# Patient Record
Sex: Male | Born: 1950 | Race: White | Hispanic: No | Marital: Married | Smoking: Never smoker
Health system: Southern US, Community
[De-identification: ages and names within clinical notes are randomized; demographics above are authoritative.]

## PROBLEM LIST (undated history)

## (undated) DIAGNOSIS — I2510 Atherosclerotic heart disease of native coronary artery without angina pectoris: Secondary | ICD-10-CM

## (undated) DIAGNOSIS — Z9289 Personal history of other medical treatment: Secondary | ICD-10-CM

## (undated) DIAGNOSIS — Z8739 Personal history of other diseases of the musculoskeletal system and connective tissue: Secondary | ICD-10-CM

## (undated) DIAGNOSIS — R739 Hyperglycemia, unspecified: Secondary | ICD-10-CM

## (undated) DIAGNOSIS — J189 Pneumonia, unspecified organism: Secondary | ICD-10-CM

## (undated) DIAGNOSIS — B1920 Unspecified viral hepatitis C without hepatic coma: Secondary | ICD-10-CM

## (undated) DIAGNOSIS — E785 Hyperlipidemia, unspecified: Secondary | ICD-10-CM

## (undated) HISTORY — PX: NOSE SURGERY: SHX723

## (undated) HISTORY — PX: EYE SURGERY: SHX253

## (undated) HISTORY — PX: HAND SURGERY: SHX662

## (undated) HISTORY — PX: RETINAL DETACHMENT SURGERY: SHX105

## (undated) HISTORY — PX: INGUINAL HERNIA REPAIR: SUR1180

## (undated) HISTORY — PX: FEMUR FRACTURE SURGERY: SHX633

---

## 1985-09-18 DIAGNOSIS — Z9289 Personal history of other medical treatment: Secondary | ICD-10-CM

## 1985-09-18 HISTORY — DX: Personal history of other medical treatment: Z92.89

## 2014-09-18 DIAGNOSIS — B1920 Unspecified viral hepatitis C without hepatic coma: Secondary | ICD-10-CM

## 2014-09-18 HISTORY — DX: Unspecified viral hepatitis C without hepatic coma: B19.20

## 2015-07-20 ENCOUNTER — Encounter: Payer: Self-pay | Admitting: Physician Assistant

## 2015-07-20 ENCOUNTER — Ambulatory Visit: Payer: Self-pay | Admitting: Physician Assistant

## 2015-07-20 VITALS — BP 120/70 | HR 88 | Temp 98.4°F

## 2015-07-20 DIAGNOSIS — J018 Other acute sinusitis: Secondary | ICD-10-CM

## 2015-07-20 MED ORDER — CEFDINIR 300 MG PO CAPS: 300.0000 mg | ORAL_CAPSULE | Freq: Two times a day (BID) | ORAL | Status: DC

## 2015-07-20 MED ORDER — FLUTICASONE PROPIONATE 50 MCG/ACT NA SUSP: 2.0000 | Freq: Every day | NASAL | Status: DC

## 2015-07-20 NOTE — Progress Notes (Signed)
S: C/o runny nose and congestion for 3 days, no fever, chills, cp/sob, v/d;  Also voice is hoarse,  cough is sporadic, .   Using otc meds:   O: PE: vitals wnl, nad,  perrl eomi, normocephalic, tms dull, nasal mucosa red and swollen, throat injected, neck supple no lymph, lungs c t a, cv rrr, neuro intact  A:  Acute sinusitis   P: omnicef 300mg  bid x 10d, flonase, otc delsym; drink fluids, continue regular meds , use otc meds of choice, return if not improving in 5 days, return earlier if worsening

## 2015-07-27 ENCOUNTER — Ambulatory Visit: Payer: Self-pay | Admitting: Physician Assistant

## 2015-07-27 ENCOUNTER — Encounter: Payer: Self-pay | Admitting: Physician Assistant

## 2015-07-27 VITALS — BP 110/75 | HR 89 | Temp 98.5°F

## 2015-07-27 DIAGNOSIS — B1920 Unspecified viral hepatitis C without hepatic coma: Secondary | ICD-10-CM | POA: Insufficient documentation

## 2015-07-27 NOTE — Progress Notes (Signed)
S: recheck of cough and congestion, no fever/chills, feels like still in chest, no cp/sob, mucus is getting more clear, still has drainage  O: vitals wnl, nad, ENT wnl, neck supple no lymph, lungs with coarse bs b/l in lower lungs, cv rrr, no edema noted  A: acute bronchitis, ?pneumonia  P: pt has cipro and wants to take this, trial of cipro 500mg  bid for 4 days, if not improving in 2 days will send cxr order to opt [facility]

## 2015-11-02 ENCOUNTER — Ambulatory Visit: Payer: Self-pay | Admitting: Physician Assistant

## 2015-11-02 ENCOUNTER — Encounter: Payer: Self-pay | Admitting: Physician Assistant

## 2015-11-02 VITALS — BP 119/70 | HR 85 | Temp 98.2°F

## 2015-11-02 DIAGNOSIS — R21 Rash and other nonspecific skin eruption: Secondary | ICD-10-CM

## 2015-11-02 MED ORDER — MUPIROCIN 2 % EX OINT: 1.0000 "application " | TOPICAL_OINTMENT | Freq: Two times a day (BID) | CUTANEOUS | Status: DC
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
## 2016-02-22 ENCOUNTER — Encounter: Payer: Self-pay | Admitting: Physician Assistant

## 2016-02-22 ENCOUNTER — Ambulatory Visit: Payer: Self-pay | Admitting: Physician Assistant

## 2016-02-22 VITALS — BP 122/70 | HR 101 | Temp 99.6°F

## 2016-02-22 DIAGNOSIS — M2540 Effusion, unspecified joint: Secondary | ICD-10-CM

## 2016-02-22 MED ORDER — INDOMETHACIN 25 MG PO CAPS
25.0000 mg | ORAL_CAPSULE | Freq: Two times a day (BID) | ORAL | Status: DC
Start: 2016-02-22 — End: 2017-02-22

## 2016-02-22 MED ORDER — CEPHALEXIN 500 MG PO CAPS: 500.0000 mg | ORAL_CAPSULE | Freq: Three times a day (TID) | ORAL | Status: DC

## 2016-02-22 NOTE — Progress Notes (Signed)
S: c/o left knee being swollen , hx of infection in same knee, sometimes when he does too much it will flare, worried that it will cause more damage, has temp today, hx of gout  O: vitals wnl, nad, skin intact, some warmth and redness at left knee, swelling superiorly, full rom, n/v intact  A: left knee pain, bursitis  P: keflex, indocin, cbc,  Uric acid drawn today

## 2016-02-23 LAB — CBC WITH DIFFERENTIAL/PLATELET
BASOS: 0 %
Basophils Absolute: 0 10*3/uL (ref 0.0–0.2)
EOS (ABSOLUTE): 0.1 10*3/uL (ref 0.0–0.4)
Eos: 1 %
HEMATOCRIT: 38.4 % (ref 37.5–51.0)
Hemoglobin: 13.1 g/dL (ref 12.6–17.7)
Immature Grans (Abs): 0 10*3/uL (ref 0.0–0.1)
Immature Granulocytes: 0 %
Lymphocytes Absolute: 0.9 10*3/uL (ref 0.7–3.1)
Lymphs: 10 %
MCH: 30.3 pg (ref 26.6–33.0)
MCHC: 34.1 g/dL (ref 31.5–35.7)
MCV: 89 fL (ref 79–97)
MONOS ABS: 0.7 10*3/uL (ref 0.1–0.9)
Monocytes: 8 %
NEUTROS ABS: 7.4 10*3/uL — AB (ref 1.4–7.0)
Neutrophils: 81 %
PLATELETS: 143 10*3/uL — AB (ref 150–379)
RBC: 4.32 x10E6/uL (ref 4.14–5.80)
RDW: 13.5 % (ref 12.3–15.4)
WBC: 9.1 10*3/uL (ref 3.4–10.8)

## 2016-02-23 LAB — URIC ACID: Uric Acid: 6.9 mg/dL (ref 3.7–8.6)

## 2017-02-20 ENCOUNTER — Emergency Department: Payer: Medicare HMO

## 2017-02-20 ENCOUNTER — Inpatient Hospital Stay
Admission: EM | Admit: 2017-02-20 | Discharge: 2017-02-22 | DRG: 287 | Disposition: A | Discharge status: Other Acute Inpatient Hospital | Payer: Medicare HMO | Attending: Cardiology | Admitting: Cardiology

## 2017-02-20 ENCOUNTER — Encounter: Payer: Self-pay | Admitting: Emergency Medicine

## 2017-02-20 ENCOUNTER — Observation Stay
Admit: 2017-02-20 | Discharge: 2017-02-20 | Disposition: A | Discharge status: Home / Self Care | Payer: Medicare HMO | Attending: Internal Medicine | Admitting: Internal Medicine

## 2017-02-20 DIAGNOSIS — I2510 Atherosclerotic heart disease of native coronary artery without angina pectoris: Secondary | ICD-10-CM

## 2017-02-20 DIAGNOSIS — R079 Chest pain, unspecified: Secondary | ICD-10-CM

## 2017-02-20 DIAGNOSIS — Z87891 Personal history of nicotine dependence: Secondary | ICD-10-CM

## 2017-02-20 DIAGNOSIS — K740 Hepatic fibrosis: Secondary | ICD-10-CM | POA: Diagnosis present

## 2017-02-20 DIAGNOSIS — B1920 Unspecified viral hepatitis C without hepatic coma: Secondary | ICD-10-CM | POA: Diagnosis present

## 2017-02-20 DIAGNOSIS — I214 Non-ST elevation (NSTEMI) myocardial infarction: Secondary | ICD-10-CM

## 2017-02-20 DIAGNOSIS — K7460 Unspecified cirrhosis of liver: Secondary | ICD-10-CM | POA: Diagnosis present

## 2017-02-20 DIAGNOSIS — R739 Hyperglycemia, unspecified: Secondary | ICD-10-CM | POA: Diagnosis present

## 2017-02-20 DIAGNOSIS — I200 Unstable angina: Secondary | ICD-10-CM | POA: Diagnosis present

## 2017-02-20 DIAGNOSIS — I25110 Atherosclerotic heart disease of native coronary artery with unstable angina pectoris: Principal | ICD-10-CM | POA: Diagnosis present

## 2017-02-20 DIAGNOSIS — E785 Hyperlipidemia, unspecified: Secondary | ICD-10-CM | POA: Diagnosis present

## 2017-02-20 DIAGNOSIS — Z79899 Other long term (current) drug therapy: Secondary | ICD-10-CM

## 2017-02-20 DIAGNOSIS — M109 Gout, unspecified: Secondary | ICD-10-CM | POA: Diagnosis present

## 2017-02-20 DIAGNOSIS — D696 Thrombocytopenia, unspecified: Secondary | ICD-10-CM | POA: Diagnosis present

## 2017-02-20 DIAGNOSIS — R0600 Dyspnea, unspecified: Secondary | ICD-10-CM | POA: Diagnosis present

## 2017-02-20 HISTORY — DX: Unspecified viral hepatitis C without hepatic coma: B19.20

## 2017-02-20 HISTORY — DX: Atherosclerotic heart disease of native coronary artery without angina pectoris: I25.10

## 2017-02-20 HISTORY — DX: Hyperglycemia, unspecified: R73.9

## 2017-02-20 HISTORY — DX: Hyperlipidemia, unspecified: E78.5

## 2017-02-20 HISTORY — DX: Personal history of other medical treatment: Z92.89

## 2017-02-20 LAB — BASIC METABOLIC PANEL
Anion gap: 7 (ref 5–15)
BUN: 14 mg/dL (ref 6–20)
CO2: 25 mmol/L (ref 22–32)
Calcium: 9.5 mg/dL (ref 8.9–10.3)
Chloride: 106 mmol/L (ref 101–111)
Creatinine, Ser: 0.81 mg/dL (ref 0.61–1.24)
Glucose, Bld: 111 mg/dL — ABNORMAL HIGH (ref 65–99)
Potassium: 3.9 mmol/L (ref 3.5–5.1)
SODIUM: 138 mmol/L (ref 135–145)

## 2017-02-20 LAB — URINE DRUG SCREEN, QUALITATIVE (ARMC ONLY)
Amphetamines, Ur Screen: NOT DETECTED
BARBITURATES, UR SCREEN: NOT DETECTED
BENZODIAZEPINE, UR SCRN: NOT DETECTED
Cannabinoid 50 Ng, Ur ~~LOC~~: NOT DETECTED
Cocaine Metabolite,Ur ~~LOC~~: NOT DETECTED
MDMA (Ecstasy)Ur Screen: NOT DETECTED
METHADONE SCREEN, URINE: NOT DETECTED
Opiate, Ur Screen: NOT DETECTED
Phencyclidine (PCP) Ur S: NOT DETECTED
TRICYCLIC, UR SCREEN: NOT DETECTED

## 2017-02-20 LAB — CBC
HCT: 41.6 % (ref 40.0–52.0)
Hemoglobin: 14.7 g/dL (ref 13.0–18.0)
MCH: 30.4 pg (ref 26.0–34.0)
MCHC: 35.3 g/dL (ref 32.0–36.0)
MCV: 86.1 fL (ref 80.0–100.0)
Platelets: 147 10*3/uL — ABNORMAL LOW (ref 150–440)
RBC: 4.84 MIL/uL (ref 4.40–5.90)
RDW: 13.7 % (ref 11.5–14.5)
WBC: 6.8 10*3/uL (ref 3.8–10.6)

## 2017-02-20 LAB — TROPONIN I
TROPONIN I: 0.04 ng/mL — AB (ref <0.03)
Troponin I: 0.05 ng/mL (ref <0.03)
Troponin I: 0.04 ng/mL (ref <0.03)

## 2017-02-20 LAB — ETHANOL

## 2017-02-20 MED ORDER — HYDROCODONE-ACETAMINOPHEN 5-325 MG PO TABS: 1.0000 | ORAL_TABLET | ORAL | Status: DC | PRN

## 2017-02-20 MED ORDER — DOCUSATE SODIUM 100 MG PO CAPS
100.0000 mg | ORAL_CAPSULE | Freq: Two times a day (BID) | ORAL | Status: DC
  Administered 2017-02-20 – 2017-02-21 (×4): 100 mg via ORAL
  Filled 2017-02-20 (×4): qty 1

## 2017-02-20 MED ORDER — ASPIRIN EC 81 MG PO TBEC
81.0000 mg | DELAYED_RELEASE_TABLET | Freq: Every day | ORAL | Status: DC
  Administered 2017-02-21 – 2017-02-22 (×2): 81 mg via ORAL
  Filled 2017-02-20 (×2): qty 1

## 2017-02-20 MED ORDER — ACETAMINOPHEN 650 MG RE SUPP: 650.0000 mg | Freq: Four times a day (QID) | RECTAL | Status: DC | PRN

## 2017-02-20 MED ORDER — NITROGLYCERIN 0.4 MG SL SUBL: 0.4000 mg | SUBLINGUAL_TABLET | SUBLINGUAL | Status: DC | PRN

## 2017-02-20 MED ORDER — TRAZODONE HCL 50 MG PO TABS
25.0000 mg | ORAL_TABLET | Freq: Every evening | ORAL | Status: DC | PRN
  Administered 2017-02-20 – 2017-02-21 (×2): 25 mg via ORAL
  Filled 2017-02-20 (×2): qty 1

## 2017-02-20 MED ORDER — ASPIRIN 81 MG PO CHEW
324.0000 mg | CHEWABLE_TABLET | Freq: Once | ORAL | Status: AC
  Administered 2017-02-20: 324 mg via ORAL
  Filled 2017-02-20: qty 4

## 2017-02-20 MED ORDER — ONDANSETRON HCL 4 MG PO TABS: 4.0000 mg | ORAL_TABLET | Freq: Four times a day (QID) | ORAL | Status: DC | PRN

## 2017-02-20 MED ORDER — BISACODYL 5 MG PO TBEC
5.0000 mg | DELAYED_RELEASE_TABLET | Freq: Every day | ORAL | Status: DC | PRN
  Filled 2017-02-20: qty 1

## 2017-02-20 MED ORDER — HEPARIN SODIUM (PORCINE) 5000 UNIT/ML IJ SOLN
5000.0000 [IU] | Freq: Three times a day (TID) | INTRAMUSCULAR | Status: DC
  Administered 2017-02-20 – 2017-02-21 (×4): 5000 [IU] via SUBCUTANEOUS
  Filled 2017-02-20 (×4): qty 1

## 2017-02-20 MED ORDER — ACETAMINOPHEN 325 MG PO TABS: 650.0000 mg | ORAL_TABLET | Freq: Four times a day (QID) | ORAL | Status: DC | PRN

## 2017-02-20 MED ORDER — ONDANSETRON HCL 4 MG/2ML IJ SOLN: 4.0000 mg | Freq: Four times a day (QID) | INTRAMUSCULAR | Status: DC | PRN

## 2017-02-20 NOTE — Progress Notes (Signed)
Patient admitted to the floor from ED chest pain, patient alert and oriented, denies any pain at this time, vss, mood calm will continue to monitor

## 2017-02-20 NOTE — H&P (Addendum)
[facility] [place] [facility]   PATIENT NAME: Jordan Wong    MR#:  [id number]  DATE OF BIRTH:  04/22/1951  DATE OF ADMISSION:  02/20/2017  PRIMARY CARE PHYSICIAN: Jerl MinaHedrick, James, MD   REQUESTING/REFERRING PHYSICIAN: Dr/Veronese  CHIEF COMPLAINT: Chest pain    Chief Complaint  Patient presents with  . Chest Pain    HISTORY OF PRESENT ILLNESS:  Jordan Wong  is a 66 y.o. male with a known history of Hep C status post treatment comes because of chest pain. Noted chest tightness since May, has been having his exertional dyspnea also since beginning of May. Says the chest pain occurs with exertion. After some time he chest pain resolves. No dizziness. No radiation of chest pain, patient main complaint is chest tightness and exertional dyspnea and because of his advanced age brought to the ER  By wife.  PAST MEDICAL HISTORY:  History reviewed. No pertinent past medical history.  PAST SURGICAL HISTOIRY:  History reviewed. No pertinent surgical history.  SOCIAL HISTORY:   Social History  Substance Use Topics  . Smoking status: Former Games developermoker  . Smokeless tobacco: Never Used  . Alcohol use Not on file    FAMILY HISTORY:  No family history on file.  DRUG ALLERGIES:  No Known Allergies  REVIEW OF SYSTEMS:  CONSTITUTIONAL: No fever, fatigue or weakness.  EYES: No blurred or double vision.  EARS, NOSE, AND THROAT: No tinnitus or ear pain.  RESPIRATORY: No cough, shortness of breath, wheezing or hemoptysis.  CARDIOVASCULAR:  Chest  tightness, exertional dyspnea. GASTROINTESTINAL: No nausea, vomiting, diarrhea or abdominal pain.  GENITOURINARY: No dysuria, hematuria.  ENDOCRINE: No polyuria, nocturia,  HEMATOLOGY: No anemia, easy bruising or bleeding SKIN: No rash or lesion. MUSCULOSKELETAL: No joint pain or arthritis.   NEUROLOGIC: No tingling, numbness, weakness.  PSYCHIATRY: No anxiety or depression.   MEDICATIONS AT HOME:   Prior to Admission  medications   Medication Sig Start Date End Date Taking? Authorizing Provider  colchicine 0.6 MG tablet Take 0.6 mg by mouth daily as needed.  12/28/16  Yes [provider]  indomethacin (INDOCIN) 25 MG capsule Take 1 capsule (25 mg total) by mouth 2 (two) times daily with a meal. Patient not taking: Reported on 02/20/2017 02/22/16   Faythe GheeFisher, Susan W, PA-C      VITAL SIGNS:  Blood pressure 131/82, pulse 63, temperature 97.6 F (36.4 C), temperature source Oral, resp. rate 18, height 5\' 8"  (1.727 m), weight 79.9 kg (176 lb 1.6 oz), SpO2 100 %.  PHYSICAL EXAMINATION:  GENERAL:  66 y.o.-year-old patient lying in the bed with no acute distress.  EYES: Pupils equal, round, reactive to light and accommodation. No scleral icterus. Extraocular muscles intact.  HEENT: Head atraumatic, normocephalic. Oropharynx and nasopharynx clear.  NECK:  Supple, no jugular venous distention. No thyroid enlargement, no tenderness.  LUNGS: Normal breath sounds bilaterally, no wheezing, rales,rhonchi or crepitation. No use of accessory muscles of respiration.  CARDIOVASCULAR: S1, S2 normal. No murmurs, rubs, or gallops.  ABDOMEN: Soft, nontender, nondistended. Bowel sounds present. No organomegaly or mass.  EXTREMITIES: No pedal edema, cyanosis, or clubbing.  NEUROLOGIC: Cranial nerves II through XII are intact. Muscle strength 5/5 in all extremities. Sensation intact. Gait not checked.  PSYCHIATRIC: The patient is alert and oriented x 3.  SKIN: No obvious rash, lesion, or ulcer.   LABORATORY PANEL:   CBC  Recent Labs Lab 02/20/17 1120  WBC 6.8  HGB 14.7  HCT 41.6  PLT 147*   ------------------------------------------------------------------------------------------------------------------  Chemistries   Recent Labs Lab 02/20/17 1120  NA 138  K 3.9  CL 106  CO2 25  GLUCOSE 111*  BUN 14  CREATININE 0.81  CALCIUM 9.5    ------------------------------------------------------------------------------------------------------------------  Cardiac Enzymes  Recent Labs Lab 02/20/17 1120  TROPONINI 0.04*   ------------------------------------------------------------------------------------------------------------------  RADIOLOGY:  Dg Chest 2 View  Result Date: 02/20/2017 CLINICAL DATA:  66 year old presenting with a 3-4 week history of intermittent mid chest pain. No associated shortness of breath. Former smoker. EXAM: CHEST  2 VIEW COMPARISON:  None. FINDINGS: Cardiomediastinal silhouette unremarkable. Lungs clear. Bronchovascular markings normal. Pulmonary vascularity normal. No visible pleural effusions. No pneumothorax. Degenerative changes involving the thoracic and upper lumbar spine. IMPRESSION: No acute cardiopulmonary disease. Electronically Signed   By: Hulan Saas M.D.   On: 02/20/2017 11:46    EKG:   Orders placed or performed during the hospital encounter of 02/20/17  . ED EKG within 10 minutes  . ED EKG within 10 minutes  . EKG 12-Lead  . EKG 12-Lead  . EKG 12-Lead  . EKG 12-Lead   Normal sinus rhythm with no ST-T changes.  Sinus rhythm  66 bpm. IMPRESSION AND PLAN:   1 chest tightness and  Exertional  dyspnea with minimal elevation of troponins. Because of advanced age admitted to telemetry under observation, continue aspirin, nitrates, cycle the troponins, obtain Myoview stress test and echocardiogram if they are abnormal consult cardiology. Unable to use bbloker due to borderline brady.obtain fasting lipids.he has no PCP yet and did not have any blood work recently.   All the records are reviewed and case discussed with ED provider. Management plans discussed with the patient, family and they are in agreement.  CODE STATUS: full  TOTAL TIME TAKING CARE OF THIS PATIENT: 55 minutes.    Katha Hamming M.D on 02/20/2017 at 3:12 PM  Between 7am to 6pm - Pager -  [phone]  After 6pm go to [email or web address] - password [email or web address]  [facility]  Office  [phone]  CC: Primary care physician; Jerl Mina, MD  Note: This dictation was prepared with Dragon dictation along with smaller phrase technology. Any transcriptional errors that result from this process are unintentional.

## 2017-02-20 NOTE — ED Triage Notes (Signed)
Pt with chest pain on and off since may, worse with exertion. NAD.

## 2017-02-20 NOTE — ED Provider Notes (Signed)
[facility] Emergency Department Provider Note  ____________________________________________  Time seen: Approximately 1:04 PM  I have reviewed the triage vital signs and the nursing notes.   HISTORY  Chief Complaint Chest Pain   HPI Jordan Wong is a 66 y.o. male with a history of hepatitis C treated in 2016 a [facility] who presents for evaluation of chest pain. Patient reports 2 months of on and off chest pain. The symptoms have gotten progressively worse and more frequent over the course of the weekend. He reports that the chest pain comes on with minimal exertion such as walking a few steps and describes as a tightness located in the center of his chest, nonradiating, associated with shortness of breath. No diaphoresis, no nausea or vomiting, no lightheadedness. Patient denies any history of smoking or family history of heart disease. Patient's last episode was yesterday evening which was more severe prompting his visit to the emergency room. The episodes go away when he sits down for a few minutes. Patient denies ever having these symptoms prior to the last 2 months. He has no discomfort at this time.Patient denies drug or alcohol since 1990.  History reviewed. No pertinent past medical history.  Patient Active Problem List   Diagnosis Date Noted  . Chest pain 02/20/2017  . Hepatitis C 07/27/2015    History reviewed. No pertinent surgical history.  Prior to Admission medications   Medication Sig Start Date End Date Taking? Authorizing Provider  colchicine 0.6 MG tablet Take 0.6 mg by mouth daily as needed.  12/28/16  Yes [provider]  indomethacin (INDOCIN) 25 MG capsule Take 1 capsule (25 mg total) by mouth 2 (two) times daily with a meal. Patient not taking: Reported on 02/20/2017 02/22/16   Faythe Ghee, PA-C    Allergies Patient has no known allergies.  History reviewed. No pertinent family history.  Social History Social History    Substance Use Topics  . Smoking status: Former Games developer  . Smokeless tobacco: Never Used  . Alcohol use Not on file    Review of Systems  Constitutional: Negative for fever. Eyes: Negative for visual changes. ENT: Negative for sore throat. Neck: No neck pain  Cardiovascular: + chest pain. Respiratory: + shortness of breath. Gastrointestinal: Negative for abdominal pain, vomiting or diarrhea. Genitourinary: Negative for dysuria. Musculoskeletal: Negative for back pain. Skin: Negative for rash. Neurological: Negative for headaches, weakness or numbness. Psych: No SI or HI  ____________________________________________   PHYSICAL EXAM:  VITAL SIGNS: Vitals:   02/20/17 1318 02/20/17 1455  BP: (!) 135/98 131/82  Pulse: (!) 58 63  Resp: 16 18  Temp: 97.8 F (36.6 C) 97.6 F (36.4 C)    Constitutional: Alert and oriented. Well appearing and in no apparent distress. HEENT:      Head: Normocephalic and atraumatic.         Eyes: Conjunctivae are normal. Sclera is non-icteric.       Mouth/Throat: Mucous membranes are moist.       Neck: Supple with no signs of meningismus. Cardiovascular: Regular rate and rhythm. No murmurs, gallops, or rubs. 2+ symmetrical distal pulses are present in all extremities. No JVD. Respiratory: Normal respiratory effort. Lungs are clear to auscultation bilaterally. No wheezes, crackles, or rhonchi.  Gastrointestinal: Soft, non tender, and non distended with positive bowel sounds. No rebound or guarding. Genitourinary: No CVA tenderness. Musculoskeletal: Nontender with normal range of motion in all extremities. No edema, cyanosis, or erythema of extremities. Neurologic: Normal speech and  language. Face is symmetric. Moving all extremities. No gross focal neurologic deficits are appreciated. Skin: Skin is warm, dry and intact. No rash noted. Psychiatric: Mood and affect are normal. Speech and behavior are  normal.  ____________________________________________   LABS (all labs ordered are listed, but only abnormal results are displayed)  Labs Reviewed  BASIC METABOLIC PANEL - Abnormal; Notable for the following:       Result Value   Glucose, Bld 111 (*)    All other components within normal limits  CBC - Abnormal; Notable for the following:    Platelets 147 (*)    All other components within normal limits  TROPONIN I - Abnormal; Notable for the following:    Troponin I 0.04 (*)    All other components within normal limits  TROPONIN I - Abnormal; Notable for the following:    Troponin I 0.05 (*)    All other components within normal limits  ETHANOL  URINE DRUG SCREEN, QUALITATIVE ([facility] ONLY)  TROPONIN I  TROPONIN I  BASIC METABOLIC PANEL  CBC  LIPID PANEL   ____________________________________________  EKG  ED ECG REPORT I, Nita Sicklearolina Jonathon Tan, the attending physician, personally viewed and interpreted this ECG.  Normal sinus rhythm, rate of 66, normal intervals, left axis deviation, no ST elevations or depressions. ____________________________________________  RADIOLOGY  CXR: No acute cardiopulmonary disease ____________________________________________   PROCEDURES  Procedure(s) performed: None Procedures Critical Care performed: yes  CRITICAL CARE Performed by: Nita Sicklearolina Charisa Twitty  ?  Total critical care time: 35 min  Critical care time was exclusive of separately billable procedures and treating other patients.  Critical care was necessary to treat or prevent imminent or life-threatening deterioration.  Critical care was time spent personally by me on the following activities: development of treatment plan with patient and/or surrogate as well as nursing, discussions with consultants, evaluation of patient's response to treatment, examination of patient, obtaining history from patient or surrogate, ordering and performing treatments and interventions, ordering  and review of laboratory studies, ordering and review of radiographic studies, pulse oximetry and re-evaluation of patient's condition.  ____________________________________________   INITIAL IMPRESSION / ASSESSMENT AND PLAN / ED COURSE  66 y.o. male [facility] who [facility] for evaluation of chest pain. No pain at this time. EKG with no ischemic changes. Troponin elevated at 0.04 concerning for NSTEMI. Will give ASA and admit to Hospitalist.     Pertinent labs & imaging results that were available during my care of the patient were reviewed by me and considered in my medical decision making (see chart for details).    ____________________________________________   FINAL CLINICAL IMPRESSION(S) / ED DIAGNOSES  Final diagnoses:  NSTEMI (non-ST elevated myocardial infarction) (HCC)      NEW MEDICATIONS STARTED DURING THIS VISIT:  Current Discharge Medication List       Note:  This document was prepared using Dragon voice recognition software and may include unintentional dictation errors.    Nita SickleVeronese, [place], MD 02/20/17 2003

## 2017-02-21 ENCOUNTER — Observation Stay (HOSPITAL_COMMUNITY): Payer: Medicare HMO

## 2017-02-21 ENCOUNTER — Encounter: Payer: Self-pay | Admitting: Physician Assistant

## 2017-02-21 DIAGNOSIS — R0600 Dyspnea, unspecified: Secondary | ICD-10-CM | POA: Diagnosis present

## 2017-02-21 DIAGNOSIS — I200 Unstable angina: Secondary | ICD-10-CM

## 2017-02-21 DIAGNOSIS — R9439 Abnormal result of other cardiovascular function study: Secondary | ICD-10-CM

## 2017-02-21 DIAGNOSIS — E785 Hyperlipidemia, unspecified: Secondary | ICD-10-CM | POA: Diagnosis present

## 2017-02-21 DIAGNOSIS — R079 Chest pain, unspecified: Secondary | ICD-10-CM

## 2017-02-21 DIAGNOSIS — R739 Hyperglycemia, unspecified: Secondary | ICD-10-CM | POA: Diagnosis present

## 2017-02-21 DIAGNOSIS — D696 Thrombocytopenia, unspecified: Secondary | ICD-10-CM | POA: Diagnosis present

## 2017-02-21 LAB — BASIC METABOLIC PANEL
ANION GAP: 6 (ref 5–15)
BUN: 15 mg/dL (ref 6–20)
CHLORIDE: 106 mmol/L (ref 101–111)
CO2: 28 mmol/L (ref 22–32)
Calcium: 9 mg/dL (ref 8.9–10.3)
Creatinine, Ser: 0.85 mg/dL (ref 0.61–1.24)
Glucose, Bld: 106 mg/dL — ABNORMAL HIGH (ref 65–99)
POTASSIUM: 4 mmol/L (ref 3.5–5.1)
SODIUM: 140 mmol/L (ref 135–145)

## 2017-02-21 LAB — GLUCOSE, CAPILLARY
Glucose-Capillary: 99 mg/dL (ref 65–99)
Glucose-Capillary: 124 mg/dL — ABNORMAL HIGH (ref 65–99)

## 2017-02-21 LAB — NM MYOCAR MULTI W/SPECT W/WALL MOTION / EF
CHL CUP NUCLEAR SDS: 13
CHL CUP NUCLEAR SSS: 19
Estimated workload: 7 METS
Exercise duration (min): 5 min
Exercise duration (sec): 25 s
LVDIAVOL: 57 mL (ref 62–150)
LVSYSVOL: 20 mL
Peak HR: 133 {beats}/min
Percent HR: 85 %
Rest HR: 77 {beats}/min
SRS: 7
TID: 1.29

## 2017-02-21 LAB — PROTIME-INR
INR: 1.07
PROTHROMBIN TIME: 13.9 s (ref 11.4–15.2)

## 2017-02-21 LAB — LIPID PANEL
CHOL/HDL RATIO: 4.6 ratio
Cholesterol: 175 mg/dL (ref 0–200)
HDL: 38 mg/dL — AB (ref >40)
LDL CALC: 116 mg/dL — AB (ref 0–99)
TRIGLYCERIDES: 103 mg/dL (ref <150)
VLDL: 21 mg/dL (ref 0–40)

## 2017-02-21 LAB — CBC
HCT: 40.9 % (ref 40.0–52.0)
Hemoglobin: 14.5 g/dL (ref 13.0–18.0)
MCH: 30.9 pg (ref 26.0–34.0)
MCHC: 35.4 g/dL (ref 32.0–36.0)
MCV: 87.1 fL (ref 80.0–100.0)
Platelets: 144 10*3/uL — ABNORMAL LOW (ref 150–440)
RBC: 4.7 MIL/uL (ref 4.40–5.90)
RDW: 14 % (ref 11.5–14.5)
WBC: 8.3 10*3/uL (ref 3.8–10.6)

## 2017-02-21 LAB — HEPATIC FUNCTION PANEL
ALBUMIN: 4.4 g/dL (ref 3.5–5.0)
ALT: 20 U/L (ref 17–63)
AST: 21 U/L (ref 15–41)
Alkaline Phosphatase: 50 U/L (ref 38–126)
Bilirubin, Direct: <0.1 mg/dL — ABNORMAL LOW (ref 0.1–0.5)
TOTAL PROTEIN: 7.3 g/dL (ref 6.5–8.1)
Total Bilirubin: 0.7 mg/dL (ref 0.3–1.2)

## 2017-02-21 LAB — HEPARIN LEVEL (UNFRACTIONATED): Heparin Unfractionated: 0.63 IU/mL (ref 0.30–0.70)

## 2017-02-21 LAB — APTT: APTT: 30 s (ref 24–36)

## 2017-02-21 LAB — TROPONIN I
TROPONIN I: 0.05 ng/mL — AB (ref <0.03)
Troponin I: 0.03 ng/mL (ref <0.03)

## 2017-02-21 LAB — PLATELET COUNT: PLATELETS: 147 10*3/uL — AB (ref 150–440)

## 2017-02-21 MED ORDER — CARVEDILOL 3.125 MG PO TABS
3.1250 mg | ORAL_TABLET | Freq: Two times a day (BID) | ORAL | Status: DC
  Administered 2017-02-21 – 2017-02-22 (×2): 3.125 mg via ORAL
  Filled 2017-02-21 (×2): qty 1

## 2017-02-21 MED ORDER — HEPARIN (PORCINE) IN NACL 100-0.45 UNIT/ML-% IJ SOLN
850.0000 [IU]/h | INTRAMUSCULAR | Status: DC
  Administered 2017-02-21: 950 [IU]/h via INTRAVENOUS
  Filled 2017-02-21: qty 250

## 2017-02-21 MED ORDER — TECHNETIUM TC 99M TETROFOSMIN IV KIT
30.0000 | PACK | Freq: Once | INTRAVENOUS | Status: AC | PRN
  Administered 2017-02-21: 30.07 via INTRAVENOUS

## 2017-02-21 MED ORDER — ENOXAPARIN SODIUM 40 MG/0.4ML ~~LOC~~ SOLN: 40.0000 mg | SUBCUTANEOUS | Status: DC

## 2017-02-21 MED ORDER — TECHNETIUM TC 99M TETROFOSMIN IV KIT
12.0500 | PACK | Freq: Once | INTRAVENOUS | Status: AC | PRN
  Administered 2017-02-21: 12.05 via INTRAVENOUS

## 2017-02-21 MED ORDER — HEPARIN BOLUS VIA INFUSION
2000.0000 [IU] | Freq: Once | INTRAVENOUS | Status: AC
  Administered 2017-02-21: 2000 [IU] via INTRAVENOUS
  Filled 2017-02-21: qty 2000

## 2017-02-21 MED ORDER — ATORVASTATIN CALCIUM 20 MG PO TABS
40.0000 mg | ORAL_TABLET | Freq: Every day | ORAL | Status: DC
  Administered 2017-02-21: 40 mg via ORAL
  Filled 2017-02-21: qty 2

## 2017-02-21 NOTE — Consult Note (Signed)
Cardiology Consultation Note  Patient ID: Jordan Wong, MRN: [id number], DOB/AGE: 1951-07-12 66 y.o. Admit date: 02/20/2017   Date of Consult: 02/21/2017 Primary Physician: Jerl Mina, MD Primary Cardiologist: new / consult  - Dr. Mariah Milling, MD Requesting Physician: Dr. Luberta Mutter, MD  Chief Complaint: Chest Pain  Reason for Consult: Unstable Angina  HPI: Jordan Wong is a 66 y.o. male who is being seen today for the evaluation of chest pain at the request of Dr. Cherlynn Kaiser, MD. Patient has a h/o Hep C s/p treatment at [facility].  Patient without any previously known cardiac history. Patient reports that he was brought to the ED on 6/6 by his wife due to c/o exertional CP that resolves with rest. Patient reports ~ 4 week history of exertional CP and chest tightness that has been associated with increased DOE. Patient has also noted an episode of chest pain that woke him up from sleep the prior week. Patient does not currently smoke or use alcohol, though does have a very remote history of tobacco use. Family history includes deceased mother, in her 56s due to liver cirrhosis, and deceased father, also in his 55's and due to asphyxiation from alcohol following the death of his wife (patient's mother). He is unable to provide family history of underlying cardiac problems for either of his parents.   Upon the patient's arrival to [facility] ED they were found to have BP 131/82, HR 63, temp 97.80F (36.4C), oxygen saturation 100% on room air, weight 79.9 kg (176 lb 1.6 oz). EKG showed NSR, PACs, left axis deviation, no acute st/t changes, CXR showed no acute cardiopulmonary disease. Labs showed flat trending troponin with a peak of 0.05, platelets at 147, and glucose at 111, SCr 0.81, K+ 4.0, UDS negative, ETOH negative, TSH normal. Patient underwent treadmill Myoview this morning per IM which was found to be high risk with significant ischemia in the septal, apical, distal anteroseptal wall concerning for hemodynamically  significant stenosis, EF estimated at 50%. Patient did note significant dyspnea with exercise with some associated chest tightness. Both improved during recovery. He is currently chest pain free.   Past Medical History:  Diagnosis Date  . Hepatitis C    a. followed [facility]; b. s/p therapy  . HLD (hyperlipidemia)   . Hyperglycemia        Most Recent Cardiac Studies: Treadmill Myoview 02/21/2017: Exercise myocardial perfusion imaging study with significant  Ischemia in the septal, apical, distal anteroseptal wall concerning for hemodynamically significant stenosis. Wall motion abnormality with stress noted in the regions detailed above,  EF estimated at 50% 1 mm ST depression with exercise noted, resolved at rest.  Abnormal study, High risk scan   Surgical History: History reviewed. No pertinent surgical history.   Home Meds: Prior to Admission medications   Medication Sig Start Date End Date Taking? Authorizing Provider  colchicine 0.6 MG tablet Take 0.6 mg by mouth daily as needed.  12/28/16  Yes [provider]  indomethacin (INDOCIN) 25 MG capsule Take 1 capsule (25 mg total) by mouth 2 (two) times daily with a meal. Patient not taking: Reported on 02/20/2017 02/22/16   Faythe Ghee, PA-C    Inpatient Medications:  . aspirin EC  81 mg Oral Daily  . docusate sodium  100 mg Oral BID  . enoxaparin (LOVENOX) injection  40 mg Subcutaneous Q24H     Allergies: No Known Allergies  Social History   Social History  . Marital status: Married    Spouse name:  N/A  . Number of children: N/A  . Years of education: N/A   Occupational History  . Not on file.   Social History Main Topics  . Smoking status: Former Games developermoker  . Smokeless tobacco: Never Used  . Alcohol use Not on file  . Drug use: No  . Sexual activity: Not on file   Other Topics Concern  . Not on file   Social History Narrative  . No narrative on file     Family History  Problem Relation Age of Onset  .  Alcohol abuse Mother   . Cirrhosis Mother        a. passed in her 6650s  . Psychiatric Illness Father        a. suicide following wife's death; b. 7050s     Review of Systems: Review of Systems  Constitutional: Positive for malaise/fatigue. Negative for chills, diaphoresis, fever and weight loss.  HENT: Negative for congestion.   Eyes: Negative for discharge and redness.  Respiratory: Positive for shortness of breath. Negative for cough, hemoptysis, sputum production and wheezing.   Cardiovascular: Positive for chest pain. Negative for palpitations, orthopnea, claudication, leg swelling and PND.  Gastrointestinal: Negative for abdominal pain, blood in stool, heartburn, melena, nausea and vomiting.  Genitourinary: Negative for hematuria.  Musculoskeletal: Negative for falls and myalgias.  Skin: Negative for rash.  Neurological: Negative for dizziness, tingling, tremors, sensory change, speech change, focal weakness, loss of consciousness and weakness.  Endo/Heme/Allergies: Does not bruise/bleed easily.  Psychiatric/Behavioral: Negative for substance abuse. The patient is not nervous/anxious.   All other systems reviewed and are negative.   Labs:  Recent Labs  02/20/17 1120 02/20/17 1512 02/20/17 1944 02/21/17 0143  TROPONINI 0.04* 0.05* 0.04* 0.05*   Lab Results  Component Value Date   WBC 8.3 02/21/2017   HGB 14.5 02/21/2017   HCT 40.9 02/21/2017   MCV 87.1 02/21/2017   PLT 144 (L) 02/21/2017     Recent Labs Lab 02/21/17 0143  NA 140  K 4.0  CL 106  CO2 28  BUN 15  CREATININE 0.85  CALCIUM 9.0  GLUCOSE 106*   Lab Results  Component Value Date   CHOL 175 02/21/2017   HDL 38 (L) 02/21/2017   LDLCALC 116 (H) 02/21/2017   TRIG 103 02/21/2017   No results found for: DDIMER  Radiology/Studies:  Dg Chest 2 View  Result Date: 02/20/2017 IMPRESSION: No acute cardiopulmonary disease. Electronically Signed   By: Hulan Saashomas  Lawrence M.D.   On: 02/20/2017 11:46   Nm  Myocar Multi W/spect W/wall Motion / Ef  Result Date: 02/21/2017 Exercise myocardial perfusion imaging study with significant  Ischemia in the septal, apical, distal anteroseptal wall concerning for hemodynamically significant stenosis. Wall motion abnormality with stress noted in the regions detailed above,  EF estimated at 50% 1 mm ST depression with exercise noted, resolved at rest. Abnormal study, High risk scan Signed, Dossie Arbourim Gollan, MD, Ph.D [facility]    EKG: Interpreted by me showed: NSR, PACs, left axis deviation, no acute st/t changes Telemetry: Interpreted by me showed: NSR  Weights: Filed Weights   02/20/17 1115 02/20/17 1455 02/21/17 0500  Weight: 180 lb (81.6 kg) 176 lb 1.6 oz (79.9 kg) 174 lb 1.6 oz (79 kg)     Physical Exam: Blood pressure 128/77, pulse 71, temperature 98.4 F (36.9 C), temperature source Oral, resp. rate 20, height 5\' 8"  (1.727 m), weight 174 lb 1.6 oz (79 kg), SpO2 97 %. Body mass index is 26.47  kg/m. General: Well developed, well nourished, in no acute distress. Head: Normocephalic, atraumatic, sclera non-icteric, no xanthomas, nares are without discharge. Right eye sclerotic.   Neck: Negative for carotid bruits. JVD not elevated. Lungs: Clear bilaterally to auscultation without wheezes, rales, or rhonchi. Breathing is unlabored. Heart: RRR with S1 S2. No murmurs, rubs, or gallops appreciated. Abdomen: Soft, non-tender, non-distended with normoactive bowel sounds. No hepatomegaly. No rebound/guarding. No obvious abdominal masses. Msk:  Strength and tone appear normal for age. Extremities: No clubbing or cyanosis. No edema. Distal pedal pulses are 2+ and equal bilaterally. Neuro: Alert and oriented X 3. No facial asymmetry. No focal deficit. Moves all extremities spontaneously. Psych:  Responds to questions appropriately with a normal affect.    Assessment and Plan:  Principal Problem:   Unstable angina (HCC) Active Problems:   Dyspnea   HLD  (hyperlipidemia)   Thrombocytopenia (HCC)   Hyperglycemia    1. Unstable angina/elevated troponin/NSTEMI/dyspnea:  -Presented with exertional chest pain as above, stress test was ordered by IM with cardiology to follow if abnormal -Currently without chest pain -Troponin minimally elevated with a peak of 0.05 -Treadmill Myoview high risk as above -Will plan for LHC with Dr. Kirke Corin, MD in the morning of 6/7 (case ~ 9:30 AM) -Risks and benefits of cardiac catheterization have been discussed with the patient including risks of bleeding, bruising, infection, kidney damage, stroke, heart attack, and death. The patient understands these risks and is willing to proceed with the procedure. All questions have been answered and concerns listened to -TTE pending -Start heparin gtt -Continue ASA -Start Coreg 3.125 mg bid with possible titration s/p LHC -Start Lipitor 40 mg daily -SL NTG prn -Cardiac rehab -Trend troponin this afternoon/evening  2. HLD: -Lipid panel with LDL 116 -Lipitor as above -Check HFP -Will need follow labs in 6-8 weeks  3. Hyperglycemia: -Check A1c  4. Thrombocytopenia: -Baseline appears to be ~160-180 -Current PLT 140s -Monitor on antiplatelet therapy -Will order platelet count to be drawn in a citrate tube (rather than typical EDTA tube) to evaluate for pseudothrombocytopenia (correction factor of 1.1)  5. Hepatitis C: -Per primary    Signed, Eula Listen, PA-C [facility] Pager: [phone] 02/21/2017, 2:27 PM

## 2017-02-21 NOTE — Care Management (Signed)
Verbally informed that patient has positive stress test and will require cardiac cath 6/7

## 2017-02-21 NOTE — Progress Notes (Signed)
ANTICOAGULATION CONSULT NOTE  Pharmacy Consult for Heparin Drip Indication: chest pain/ACS  No Known Allergies  Patient Measurements: Height: 5\' 8"  (172.7 cm) Weight: 174 lb 1.6 oz (79 kg) IBW/kg (Calculated) : 68.4 Heparin Dosing Weight: 79 kg  Vital Signs: Temp: 98.4 F (36.9 C) (06/06 1145) Temp Source: Oral (06/06 1145) BP: 128/77 (06/06 1145) Pulse Rate: 71 (06/06 1145)  Labs:  Recent Labs  02/20/17 1120 02/20/17 1512 02/20/17 1944 02/21/17 0143  HGB 14.7  --   --  14.5  HCT 41.6  --   --  40.9  PLT 147*  --   --  144*  CREATININE 0.81  --   --  0.85  TROPONINI 0.04* 0.05* 0.04* 0.05*    Estimated Creatinine Clearance: 83.8 mL/min (by C-G formula based on SCr of 0.85 mg/dL).   Medical History: Past Medical History:  Diagnosis Date  . Hepatitis C    a. followed [facility]; b. s/p therapy  . HLD (hyperlipidemia)   . Hyperglycemia     Assessment: [name] with a history of hepatitis C admitted with CP. Patient received doses of subcutaneous heparin today for DVT prophylaxis.   Goal of Therapy:  Heparin level 0.3-0.7 units/ml Monitor platelets by anticoagulation protocol: Yes   Plan:  Will give half-bolus due to recent subcutaneous heparin administration.  Give 2000 units bolus x 1 Start heparin infusion at 950 units/hr Check anti-Xa level in 6 hours and daily while on heparin Continue to monitor H&H and platelets  Jordan Wong, Jordan Wong D 02/21/2017,2:49 PM

## 2017-02-21 NOTE — Care Management Obs Status (Signed)
MEDICARE OBSERVATION STATUS NOTIFICATION   Patient Details  Name: Jordan Wong MRN: [id number] Date of Birth: 04/30/1951   Medicare Observation Status Notification Given:  Yes    Eber HongGreene, Kwamaine Cuppett R, RN 02/21/2017, 1:58 PM

## 2017-02-21 NOTE — Progress Notes (Signed)
[facility] [place] [facility]   PATIENT NAME: Jordan Wong    MR#:  [id number]  DATE OF BIRTH:  09/27/1950  SUBJECTIVE:   Pt. Here with Chest pain on exertion.  Had Myoview today which showed with significant  Ischemia in the septal, apical, distal anteroseptal wall concerning for hemodynamically significant stenosis. EF was normal. Plan for cardiac cath tomorrow.   REVIEW OF SYSTEMS:    Review of Systems  Constitutional: Negative for chills and fever.  HENT: Negative for congestion and tinnitus.   Eyes: Negative for blurred vision and double vision.  Respiratory: Negative for cough, shortness of breath and wheezing.   Cardiovascular: Positive for chest pain. Negative for orthopnea and PND.  Gastrointestinal: Negative for abdominal pain, diarrhea, nausea and vomiting.  Genitourinary: Negative for dysuria and hematuria.  Neurological: Negative for dizziness, sensory change and focal weakness.  All other systems reviewed and are negative.    Nutrition: Heart Healthy Tolerating Diet: Yes Tolerating PT: Ambulatory   DRUG ALLERGIES:  No Known Allergies  VITALS:  Blood pressure 128/77, pulse 71, temperature 98.4 F (36.9 C), temperature source Oral, resp. rate 20, height 5\' 8"  (1.727 m), weight 79 kg (174 lb 1.6 oz), SpO2 97 %.  PHYSICAL EXAMINATION:   Physical Exam  GENERAL:  66 y.o.-year-old patient lying in bed in no acute distress.  EYES: Pupils equal, round, reactive to light and accommodation. No scleral icterus. Extraocular muscles intact.  HEENT: Head atraumatic, normocephalic. Oropharynx and nasopharynx clear.  NECK:  Supple, no jugular venous distention. No thyroid enlargement, no tenderness.  LUNGS: Normal breath sounds bilaterally, no wheezing, rales, rhonchi. No use of accessory muscles of respiration.  CARDIOVASCULAR: S1, S2 normal. No murmurs, rubs, or gallops.  ABDOMEN: Soft, nontender, nondistended. Bowel sounds present. No organomegaly or  mass.  EXTREMITIES: No cyanosis, clubbing or edema b/l.    NEUROLOGIC: Cranial nerves II through XII are intact. No focal Motor or sensory deficits b/l.   PSYCHIATRIC: The patient is alert and oriented x 3.  SKIN: No obvious rash, lesion, or ulcer.    LABORATORY PANEL:   CBC  Recent Labs Lab 02/21/17 0143  WBC 8.3  HGB 14.5  HCT 40.9  PLT 144*   ------------------------------------------------------------------------------------------------------------------  Chemistries   Recent Labs Lab 02/21/17 0143  NA 140  K 4.0  CL 106  CO2 28  GLUCOSE 106*  BUN 15  CREATININE 0.85  CALCIUM 9.0   ------------------------------------------------------------------------------------------------------------------  Cardiac Enzymes  Recent Labs Lab 02/21/17 0143  TROPONINI 0.05*   ------------------------------------------------------------------------------------------------------------------  RADIOLOGY:  Dg Chest 2 View  Result Date: 02/20/2017 CLINICAL DATA:  66 year old presenting with a 3-4 week history of intermittent mid chest pain. No associated shortness of breath. Former smoker. EXAM: CHEST  2 VIEW COMPARISON:  None. FINDINGS: Cardiomediastinal silhouette unremarkable. Lungs clear. Bronchovascular markings normal. Pulmonary vascularity normal. No visible pleural effusions. No pneumothorax. Degenerative changes involving the thoracic and upper lumbar spine. IMPRESSION: No acute cardiopulmonary disease. Electronically Signed   By: Hulan Saashomas  Lawrence M.D.   On: 02/20/2017 11:46   Nm Myocar Multi W/spect W/wall Motion / Ef  Result Date: 02/21/2017 Exercise myocardial perfusion imaging study with significant  Ischemia in the septal, apical, distal anteroseptal wall concerning for hemodynamically significant stenosis. Wall motion abnormality with stress noted in the regions detailed above,  EF estimated at 50% 1 mm ST depression with exercise noted, resolved at rest. Abnormal  study, High risk scan Signed, Dossie Arbourim Gollan, MD, Ph.D [facility]  ASSESSMENT AND PLAN:   66 year old male with hx of Gout who presented to the hospital with exertional Chest pain.   1. Exertional chest pain-patient had a stress test today which was positive for hemodynamically significant stenosis. -Clinically asymptomatic now. -Continue aspirin, nitroglycerin. Plan for cardiac catheterization tomorrow.  2. Gout-no acute attack presently.     All the records are reviewed and case discussed with Care Management/Social Worker. Management plans discussed with the patient, family and they are in agreement.  CODE STATUS: Full  DVT Prophylaxis: Lovenox  TOTAL TIME TAKING CARE OF THIS PATIENT: 30 minutes.   POSSIBLE D/C IN 1-2 DAYS, DEPENDING ON CLINICAL CONDITION.   Houston Siren M.D on 02/21/2017 at 2:19 PM  Between 7am to 6pm - Pager - [phone]  After 6pm go to [email or web address] - Social research officer, government  [facility] [place] [facility]  Office  [phone]  CC: Primary care physician; Jerl Mina, MD

## 2017-02-22 ENCOUNTER — Encounter (HOSPITAL_COMMUNITY): Payer: Self-pay | Admitting: General Practice

## 2017-02-22 ENCOUNTER — Encounter
Admission: EM | Disposition: A | Discharge status: Other Acute Inpatient Hospital | Payer: Self-pay | Source: Home / Self Care | Attending: Specialist

## 2017-02-22 ENCOUNTER — Other Ambulatory Visit: Payer: Self-pay | Admitting: Nurse Practitioner

## 2017-02-22 ENCOUNTER — Inpatient Hospital Stay (HOSPITAL_COMMUNITY)
Admission: AD | Admit: 2017-02-22 | Discharge: 2017-02-27 | DRG: 236 | Disposition: A | Discharge status: Home / Self Care | Payer: Medicare HMO | Source: Other Acute Inpatient Hospital | Attending: Surgery | Admitting: Surgery

## 2017-02-22 ENCOUNTER — Other Ambulatory Visit: Payer: Self-pay | Admitting: *Deleted

## 2017-02-22 ENCOUNTER — Inpatient Hospital Stay (HOSPITAL_COMMUNITY): Payer: Medicare HMO

## 2017-02-22 DIAGNOSIS — K5900 Constipation, unspecified: Secondary | ICD-10-CM | POA: Diagnosis not present

## 2017-02-22 DIAGNOSIS — R739 Hyperglycemia, unspecified: Secondary | ICD-10-CM | POA: Diagnosis present

## 2017-02-22 DIAGNOSIS — Z79899 Other long term (current) drug therapy: Secondary | ICD-10-CM | POA: Diagnosis not present

## 2017-02-22 DIAGNOSIS — E8770 Fluid overload, unspecified: Secondary | ICD-10-CM | POA: Diagnosis not present

## 2017-02-22 DIAGNOSIS — Z811 Family history of alcohol abuse and dependence: Secondary | ICD-10-CM | POA: Diagnosis not present

## 2017-02-22 DIAGNOSIS — R079 Chest pain, unspecified: Secondary | ICD-10-CM | POA: Diagnosis not present

## 2017-02-22 DIAGNOSIS — K7460 Unspecified cirrhosis of liver: Secondary | ICD-10-CM | POA: Diagnosis present

## 2017-02-22 DIAGNOSIS — D62 Acute posthemorrhagic anemia: Secondary | ICD-10-CM | POA: Diagnosis not present

## 2017-02-22 DIAGNOSIS — I2510 Atherosclerotic heart disease of native coronary artery without angina pectoris: Secondary | ICD-10-CM

## 2017-02-22 DIAGNOSIS — Z01810 Encounter for preprocedural cardiovascular examination: Secondary | ICD-10-CM

## 2017-02-22 DIAGNOSIS — Z87891 Personal history of nicotine dependence: Secondary | ICD-10-CM

## 2017-02-22 DIAGNOSIS — M109 Gout, unspecified: Secondary | ICD-10-CM | POA: Diagnosis present

## 2017-02-22 DIAGNOSIS — D696 Thrombocytopenia, unspecified: Secondary | ICD-10-CM | POA: Diagnosis not present

## 2017-02-22 DIAGNOSIS — I25110 Atherosclerotic heart disease of native coronary artery with unstable angina pectoris: Secondary | ICD-10-CM | POA: Diagnosis present

## 2017-02-22 DIAGNOSIS — Z7982 Long term (current) use of aspirin: Secondary | ICD-10-CM

## 2017-02-22 DIAGNOSIS — K740 Hepatic fibrosis: Secondary | ICD-10-CM | POA: Diagnosis present

## 2017-02-22 DIAGNOSIS — B1920 Unspecified viral hepatitis C without hepatic coma: Secondary | ICD-10-CM | POA: Diagnosis present

## 2017-02-22 DIAGNOSIS — E785 Hyperlipidemia, unspecified: Secondary | ICD-10-CM | POA: Diagnosis present

## 2017-02-22 DIAGNOSIS — Z951 Presence of aortocoronary bypass graft: Secondary | ICD-10-CM

## 2017-02-22 DIAGNOSIS — I200 Unstable angina: Secondary | ICD-10-CM | POA: Diagnosis present

## 2017-02-22 HISTORY — PX: LEFT HEART CATH AND CORONARY ANGIOGRAPHY: CATH118249

## 2017-02-22 HISTORY — DX: Pneumonia, unspecified organism: J18.9

## 2017-02-22 HISTORY — DX: Personal history of other medical treatment: Z92.89

## 2017-02-22 HISTORY — PX: CARDIAC CATHETERIZATION: SHX172

## 2017-02-22 HISTORY — DX: Personal history of other diseases of the musculoskeletal system and connective tissue: Z87.39

## 2017-02-22 LAB — SPIROMETRY WITH GRAPH
FEF 25-75 PRE: 2.03 L/s
FEF 25-75 Post: 2.85 L/sec
FEF2575-%CHANGE-POST: 40 %
FEF2575-%PRED-PRE: 77 %
FEF2575-%Pred-Post: 109 %
FEV1-%Change-Post: 12 %
FEV1-%PRED-POST: 94 %
FEV1-%Pred-Pre: 84 %
FEV1-PRE: 2.77 L
FEV1-Post: 3.13 L
FEV1FVC-%CHANGE-POST: 10 %
FEV1FVC-%Pred-Pre: 93 %
FEV6-%CHANGE-POST: 3 %
FEV6-%PRED-PRE: 92 %
FEV6-%Pred-Post: 95 %
FEV6-Post: 4 L
FEV6-Pre: 3.87 L
FEV6FVC-%Change-Post: 0 %
FEV6FVC-%Pred-Post: 103 %
FEV6FVC-%Pred-Pre: 104 %
FVC-%Change-Post: 2 %
FVC-%Pred-Post: 91 %
FVC-%Pred-Pre: 89 %
FVC-Post: 4.05 L
FVC-Pre: 3.95 L
POST FEV1/FVC RATIO: 77 %
POST FEV6/FVC RATIO: 99 %
PRE FEV1/FVC RATIO: 70 %
Pre FEV6/FVC Ratio: 99 %

## 2017-02-22 LAB — CBC
HCT: 42.5 % (ref 40.0–52.0)
HEMOGLOBIN: 15 g/dL (ref 13.0–18.0)
MCH: 31.1 pg (ref 26.0–34.0)
MCHC: 35.3 g/dL (ref 32.0–36.0)
MCV: 88.2 fL (ref 80.0–100.0)
Platelets: 151 10*3/uL (ref 150–440)
RBC: 4.82 MIL/uL (ref 4.40–5.90)
RDW: 14.1 % (ref 11.5–14.5)
WBC: 7.9 10*3/uL (ref 3.8–10.6)

## 2017-02-22 LAB — VAS US DOPPLER PRE CABG
LCCADDIAS: -34 cm/s
LCCADSYS: -125 cm/s
LCCAPDIAS: 20 cm/s
LEFT ECA DIAS: -13 cm/s
LEFT VERTEBRAL DIAS: 16 cm/s
LICADDIAS: -32 cm/s
LICADSYS: -87 cm/s
LICAPDIAS: -23 cm/s
LICAPSYS: -110 cm/s
Left CCA prox sys: 96 cm/s
RCCAPSYS: 107 cm/s
RIGHT ECA DIAS: -13 cm/s
RIGHT VERTEBRAL DIAS: 10 cm/s
Right CCA prox dias: 20 cm/s
Right cca dist sys: -96 cm/s

## 2017-02-22 LAB — ECHOCARDIOGRAM COMPLETE
Height: 68 in
Weight: 2817.6 oz

## 2017-02-22 LAB — PROTIME-INR
INR: 1.13
PROTHROMBIN TIME: 14.6 s (ref 11.4–15.2)

## 2017-02-22 LAB — BASIC METABOLIC PANEL
ANION GAP: 8 (ref 5–15)
BUN: 13 mg/dL (ref 6–20)
CHLORIDE: 105 mmol/L (ref 101–111)
CO2: 26 mmol/L (ref 22–32)
Calcium: 9.1 mg/dL (ref 8.9–10.3)
Creatinine, Ser: 0.98 mg/dL (ref 0.61–1.24)
GFR calc non Af Amer: >60 mL/min (ref >60)
Glucose, Bld: 116 mg/dL — ABNORMAL HIGH (ref 65–99)
Potassium: 3.7 mmol/L (ref 3.5–5.1)
Sodium: 139 mmol/L (ref 135–145)

## 2017-02-22 LAB — HEPARIN LEVEL (UNFRACTIONATED): HEPARIN UNFRACTIONATED: 0.74 [IU]/mL — AB (ref 0.30–0.70)

## 2017-02-22 LAB — ABO/RH: ABO/RH(D): A POS

## 2017-02-22 LAB — TYPE AND SCREEN
ABO/RH(D): A POS
ANTIBODY SCREEN: NEGATIVE

## 2017-02-22 LAB — SURGICAL PCR SCREEN
MRSA, PCR: NEGATIVE
Staphylococcus aureus: NEGATIVE

## 2017-02-22 LAB — HEMOGLOBIN A1C
Hgb A1c MFr Bld: 4.9 % (ref 4.8–5.6)
Mean Plasma Glucose: 94 mg/dL

## 2017-02-22 LAB — GLUCOSE, CAPILLARY: Glucose-Capillary: 108 mg/dL — ABNORMAL HIGH (ref 65–99)

## 2017-02-22 SURGERY — LEFT HEART CATH AND CORONARY ANGIOGRAPHY
Anesthesia: Moderate Sedation

## 2017-02-22 SURGERY — LEFT HEART CATH
Anesthesia: Moderate Sedation | Laterality: Left

## 2017-02-22 MED ORDER — MIDAZOLAM HCL 2 MG/2ML IJ SOLN
INTRAMUSCULAR | Status: DC | PRN
  Administered 2017-02-22: 1 mg via INTRAVENOUS

## 2017-02-22 MED ORDER — CARVEDILOL 3.125 MG PO TABS
3.1250 mg | ORAL_TABLET | Freq: Two times a day (BID) | ORAL | Status: DC
Start: 2017-02-22 — End: 2017-02-23
  Administered 2017-02-22: 3.125 mg via ORAL
  Filled 2017-02-22: qty 1

## 2017-02-22 MED ORDER — DIAZEPAM 5 MG PO TABS
5.0000 mg | ORAL_TABLET | Freq: Once | ORAL | Status: AC
  Administered 2017-02-23: 5 mg via ORAL
  Filled 2017-02-22: qty 1

## 2017-02-22 MED ORDER — TEMAZEPAM 15 MG PO CAPS
15.0000 mg | ORAL_CAPSULE | Freq: Once | ORAL | Status: AC | PRN
  Administered 2017-02-22: 15 mg via ORAL
  Filled 2017-02-22: qty 1

## 2017-02-22 MED ORDER — HEPARIN (PORCINE) IN NACL 100-0.45 UNIT/ML-% IJ SOLN
950.0000 [IU]/h | INTRAMUSCULAR | Status: DC
  Administered 2017-02-22: 850 [IU]/h via INTRAVENOUS
  Filled 2017-02-22: qty 250

## 2017-02-22 MED ORDER — BISACODYL 5 MG PO TBEC
5.0000 mg | DELAYED_RELEASE_TABLET | Freq: Once | ORAL | Status: AC
  Administered 2017-02-22: 5 mg via ORAL
  Filled 2017-02-22: qty 1

## 2017-02-22 MED ORDER — DOPAMINE-DEXTROSE 3.2-5 MG/ML-% IV SOLN
0.0000 ug/kg/min | INTRAVENOUS | Status: DC
  Filled 2017-02-22: qty 250

## 2017-02-22 MED ORDER — ATORVASTATIN CALCIUM 40 MG PO TABS: 40.0000 mg | ORAL_TABLET | Freq: Every day | ORAL | Status: DC

## 2017-02-22 MED ORDER — FENTANYL CITRATE (PF) 100 MCG/2ML IJ SOLN
INTRAMUSCULAR | Status: DC | PRN
Start: 2017-02-22 — End: 2017-02-22
  Administered 2017-02-22: 25 ug via INTRAVENOUS

## 2017-02-22 MED ORDER — PAPAVERINE HCL 30 MG/ML IJ SOLN
INTRAMUSCULAR | Status: DC
Start: 2017-02-23 — End: 2017-02-23
  Filled 2017-02-22: qty 2.5

## 2017-02-22 MED ORDER — ASPIRIN EC 81 MG PO TBEC
81.0000 mg | DELAYED_RELEASE_TABLET | Freq: Every day | ORAL | Status: DC
  Administered 2017-02-22: 81 mg via ORAL
  Filled 2017-02-22: qty 1

## 2017-02-22 MED ORDER — VERAPAMIL HCL 2.5 MG/ML IV SOLN
INTRAVENOUS | Status: DC | PRN
  Administered 2017-02-22: 2.5 mg via INTRA_ARTERIAL

## 2017-02-22 MED ORDER — ONDANSETRON HCL 4 MG/2ML IJ SOLN: 4.0000 mg | Freq: Four times a day (QID) | INTRAMUSCULAR | Status: DC | PRN

## 2017-02-22 MED ORDER — MAGNESIUM SULFATE 50 % IJ SOLN
40.0000 meq | INTRAMUSCULAR | Status: DC
Start: 2017-02-23 — End: 2017-02-23
  Filled 2017-02-22: qty 10

## 2017-02-22 MED ORDER — SODIUM CHLORIDE 0.9 % IV SOLN: 250.0000 mL | INTRAVENOUS | Status: DC | PRN

## 2017-02-22 MED ORDER — HEPARIN SODIUM (PORCINE) 1000 UNIT/ML IJ SOLN
INTRAMUSCULAR | Status: AC
Start: 2017-02-22 — End: ?
  Filled 2017-02-22: qty 1

## 2017-02-22 MED ORDER — DEXTROSE 5 % IV SOLN
750.0000 mg | INTRAVENOUS | Status: DC
  Filled 2017-02-22: qty 750

## 2017-02-22 MED ORDER — IOPAMIDOL (ISOVUE-300) INJECTION 61%
INTRAVENOUS | Status: DC | PRN
  Administered 2017-02-22: 40 mL via INTRA_ARTERIAL

## 2017-02-22 MED ORDER — NITROGLYCERIN 0.4 MG SL SUBL
0.4000 mg | SUBLINGUAL_TABLET | SUBLINGUAL | 12 refills | Status: DC | PRN
Start: 2017-02-22 — End: 2017-02-27

## 2017-02-22 MED ORDER — CHLORHEXIDINE GLUCONATE CLOTH 2 % EX PADS
6.0000 | MEDICATED_PAD | Freq: Once | CUTANEOUS | Status: AC
  Administered 2017-02-23: 6 via TOPICAL

## 2017-02-22 MED ORDER — SODIUM CHLORIDE 0.9% FLUSH
3.0000 mL | Freq: Two times a day (BID) | INTRAVENOUS | Status: DC
Start: 2017-02-22 — End: 2017-02-22

## 2017-02-22 MED ORDER — SODIUM CHLORIDE 0.9% FLUSH: 3.0000 mL | Freq: Two times a day (BID) | INTRAVENOUS | Status: DC

## 2017-02-22 MED ORDER — HEPARIN (PORCINE) IN NACL 100-0.45 UNIT/ML-% IJ SOLN: 850.0000 [IU]/h | INTRAMUSCULAR | Status: DC

## 2017-02-22 MED ORDER — ACETAMINOPHEN 325 MG PO TABS: 650.0000 mg | ORAL_TABLET | Freq: Four times a day (QID) | ORAL | Status: AC | PRN

## 2017-02-22 MED ORDER — METOPROLOL TARTRATE 12.5 MG HALF TABLET
12.5000 mg | ORAL_TABLET | Freq: Once | ORAL | Status: AC
  Administered 2017-02-23: 12.5 mg via ORAL
  Filled 2017-02-22: qty 1

## 2017-02-22 MED ORDER — CARVEDILOL 3.125 MG PO TABS: 3.1250 mg | ORAL_TABLET | Freq: Two times a day (BID) | ORAL | Status: DC

## 2017-02-22 MED ORDER — ASPIRIN 81 MG PO TBEC: 81.0000 mg | DELAYED_RELEASE_TABLET | Freq: Every day | ORAL | Status: DC

## 2017-02-22 MED ORDER — HEPARIN (PORCINE) IN NACL 2-0.9 UNIT/ML-% IJ SOLN
INTRAMUSCULAR | Status: AC
  Filled 2017-02-22: qty 500

## 2017-02-22 MED ORDER — NITROGLYCERIN 0.4 MG SL SUBL: 0.4000 mg | SUBLINGUAL_TABLET | SUBLINGUAL | Status: DC | PRN

## 2017-02-22 MED ORDER — SODIUM CHLORIDE 0.9% FLUSH: 3.0000 mL | INTRAVENOUS | Status: DC | PRN

## 2017-02-22 MED ORDER — SODIUM CHLORIDE 0.9 % IV SOLN: INTRAVENOUS | Status: DC

## 2017-02-22 MED ORDER — ACETAMINOPHEN 325 MG PO TABS: 650.0000 mg | ORAL_TABLET | ORAL | Status: DC | PRN

## 2017-02-22 MED ORDER — SODIUM CHLORIDE 0.9% FLUSH
3.0000 mL | INTRAVENOUS | Status: DC | PRN
Start: 2017-02-22 — End: 2017-02-22

## 2017-02-22 MED ORDER — ATORVASTATIN CALCIUM 40 MG PO TABS
40.0000 mg | ORAL_TABLET | Freq: Every day | ORAL | Status: DC
  Administered 2017-02-22: 40 mg via ORAL
  Filled 2017-02-22: qty 1

## 2017-02-22 MED ORDER — MIDAZOLAM HCL 2 MG/2ML IJ SOLN
INTRAMUSCULAR | Status: AC
  Filled 2017-02-22: qty 2

## 2017-02-22 MED ORDER — VANCOMYCIN HCL 10 G IV SOLR
1500.0000 mg | INTRAVENOUS | Status: DC
  Administered 2017-02-23: 1500 mg via INTRAVENOUS
  Filled 2017-02-22: qty 1500

## 2017-02-22 MED ORDER — DEXTROSE 5 % IV SOLN
1.5000 g | INTRAVENOUS | Status: DC
  Administered 2017-02-23: 1.5 g via INTRAVENOUS
  Administered 2017-02-23: .75 g via INTRAVENOUS
  Filled 2017-02-22: qty 1.5

## 2017-02-22 MED ORDER — CHLORHEXIDINE GLUCONATE CLOTH 2 % EX PADS
6.0000 | MEDICATED_PAD | Freq: Once | CUTANEOUS | Status: AC
  Administered 2017-02-22: 6 via TOPICAL

## 2017-02-22 MED ORDER — DEXMEDETOMIDINE HCL IN NACL 400 MCG/100ML IV SOLN
0.1000 ug/kg/h | INTRAVENOUS | Status: DC
  Administered 2017-02-23: .3 ug/kg/h via INTRAVENOUS
  Filled 2017-02-22: qty 100

## 2017-02-22 MED ORDER — ONDANSETRON HCL 4 MG PO TABS: 4.0000 mg | ORAL_TABLET | Freq: Four times a day (QID) | ORAL | 0 refills | Status: DC | PRN

## 2017-02-22 MED ORDER — FENTANYL CITRATE (PF) 100 MCG/2ML IJ SOLN
INTRAMUSCULAR | Status: AC
  Filled 2017-02-22: qty 2

## 2017-02-22 MED ORDER — INSULIN REGULAR HUMAN 100 UNIT/ML IJ SOLN
INTRAMUSCULAR | Status: DC
  Administered 2017-02-23: 1 [IU]/h via INTRAVENOUS
  Filled 2017-02-22: qty 1

## 2017-02-22 MED ORDER — SODIUM CHLORIDE 0.9 % IV SOLN
INTRAVENOUS | Status: DC
  Filled 2017-02-22: qty 30

## 2017-02-22 MED ORDER — HEPARIN SODIUM (PORCINE) 1000 UNIT/ML IJ SOLN
INTRAMUSCULAR | Status: DC | PRN
  Administered 2017-02-22: 4000 [IU] via INTRAVENOUS

## 2017-02-22 MED ORDER — SODIUM CHLORIDE 0.9 % WEIGHT BASED INFUSION: 1.0000 mL/kg/h | INTRAVENOUS | Status: DC

## 2017-02-22 MED ORDER — TRANEXAMIC ACID (OHS) BOLUS VIA INFUSION
15.0000 mg/kg | INTRAVENOUS | Status: DC
  Administered 2017-02-23: 1261.5 mg via INTRAVENOUS
  Filled 2017-02-22: qty 1262

## 2017-02-22 MED ORDER — ALBUTEROL SULFATE (2.5 MG/3ML) 0.083% IN NEBU
2.5000 mg | INHALATION_SOLUTION | Freq: Once | RESPIRATORY_TRACT | Status: AC
  Administered 2017-02-22: 2.5 mg via RESPIRATORY_TRACT

## 2017-02-22 MED ORDER — VERAPAMIL HCL 2.5 MG/ML IV SOLN
INTRAVENOUS | Status: AC
  Filled 2017-02-22: qty 2

## 2017-02-22 MED ORDER — SODIUM CHLORIDE 0.9 % IV SOLN
30.0000 ug/min | INTRAVENOUS | Status: DC
  Administered 2017-02-23: 25 ug/min via INTRAVENOUS
  Filled 2017-02-22: qty 2

## 2017-02-22 MED ORDER — ZOLPIDEM TARTRATE 5 MG PO TABS: 5.0000 mg | ORAL_TABLET | Freq: Every evening | ORAL | Status: DC | PRN

## 2017-02-22 MED ORDER — PNEUMOCOCCAL VAC POLYVALENT 25 MCG/0.5ML IJ INJ
0.5000 mL | INJECTION | INTRAMUSCULAR | Status: DC
  Filled 2017-02-22: qty 0.5

## 2017-02-22 MED ORDER — CHLORHEXIDINE GLUCONATE 0.12 % MT SOLN
15.0000 mL | Freq: Once | OROMUCOSAL | Status: AC
  Administered 2017-02-23: 15 mL via OROMUCOSAL
  Filled 2017-02-22: qty 15

## 2017-02-22 MED ORDER — TRANEXAMIC ACID (OHS) PUMP PRIME SOLUTION
2.0000 mg/kg | INTRAVENOUS | Status: DC
Start: 2017-02-23 — End: 2017-02-23
  Filled 2017-02-22: qty 1.68

## 2017-02-22 MED ORDER — POTASSIUM CHLORIDE 2 MEQ/ML IV SOLN
80.0000 meq | INTRAVENOUS | Status: DC
  Filled 2017-02-22: qty 40

## 2017-02-22 MED ORDER — SODIUM CHLORIDE 0.9 % WEIGHT BASED INFUSION
3.0000 mL/kg/h | INTRAVENOUS | Status: DC
  Administered 2017-02-22: 3 mL/kg/h via INTRAVENOUS

## 2017-02-22 MED ORDER — SODIUM CHLORIDE 0.9 % IV SOLN
1.5000 mg/kg/h | INTRAVENOUS | Status: DC
Start: 2017-02-23 — End: 2017-02-23
  Administered 2017-02-23: 1.5 mg/kg/h via INTRAVENOUS
  Filled 2017-02-22: qty 25

## 2017-02-22 MED ORDER — EPINEPHRINE PF 1 MG/ML IJ SOLN
0.0000 ug/min | INTRAVENOUS | Status: DC
  Filled 2017-02-22: qty 4

## 2017-02-22 MED ORDER — ASPIRIN EC 81 MG PO TBEC: 81.0000 mg | DELAYED_RELEASE_TABLET | Freq: Every day | ORAL | Status: DC

## 2017-02-22 MED ORDER — NITROGLYCERIN IN D5W 200-5 MCG/ML-% IV SOLN
2.0000 ug/min | INTRAVENOUS | Status: DC
  Filled 2017-02-22: qty 250

## 2017-02-22 SURGICAL SUPPLY — 6 items
CATH OPTITORQUE JACKY 4.0 5F (CATHETERS) ×3 IMPLANT
DEVICE RAD TR BAND REGULAR (VASCULAR PRODUCTS) ×3 IMPLANT
GLIDESHEATH SLEND SS 6F .021 (SHEATH) ×3 IMPLANT
KIT MANI 3VAL PERCEP (MISCELLANEOUS) ×3 IMPLANT
PACK CARDIAC CATH (CUSTOM PROCEDURE TRAY) ×3 IMPLANT
WIRE ROSEN-J .035X260CM (WIRE) ×3 IMPLANT

## 2017-02-22 NOTE — Progress Notes (Signed)
ANTICOAGULATION CONSULT NOTE  Pharmacy Consult for Heparin Drip Indication: chest pain/ACS  No Known Allergies  Patient Measurements: Height: 5\' 8"  (172.7 cm) Weight: 175 lb 14.4 oz (79.8 kg) IBW/kg (Calculated) : 68.4 Heparin Dosing Weight: 79 kg  Vital Signs: Temp: 98.6 F (37 C) (06/07 0903) Temp Source: Oral (06/07 0903) BP: 128/82 (06/07 1055) Pulse Rate: 75 (06/07 1055)  Labs:  Recent Labs  02/20/17 1120  02/20/17 1944 02/21/17 0143 02/21/17 1458 02/21/17 2138 02/22/17 0509  HGB 14.7  --   --  14.5  --   --  15.0  HCT 41.6  --   --  40.9  --   --  42.5  PLT 147*  --   --  144* 147*  --  151  APTT  --   --   --   --  30  --   --   LABPROT  --   --   --   --  13.9  --  14.6  INR  --   --   --   --  1.07  --  1.13  HEPARINUNFRC  --   --   --   --   --  0.63 0.74*  CREATININE 0.81  --   --  0.85  --   --  0.98  TROPONINI 0.04*  < > 0.04* 0.05* 0.03*  --   --   < > = values in this interval not displayed.  Estimated Creatinine Clearance: 72.7 mL/min (by C-G formula based on SCr of 0.98 mg/dL).   Medical History: Past Medical History:  Diagnosis Date  . Hepatitis C    a. followed [facility]; b. s/p therapy  . HLD (hyperlipidemia)   . Hyperglycemia     Assessment: [name] with a history of hepatitis C admitted with CP. 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 Heparin to resume 6 hours after sheath removal.   Goal of Therapy:  Heparin level 0.3-0.7 units/ml Monitor platelets by anticoagulation protocol: Yes   Plan:  Will resume heparin drip at 850 units/hr 6 hours after sheath removal. Initial HL 6 hours after resuming infusion.   Luisa HartScott Quashaun Lazalde, PharmD Clinical Pharmacist  02/22/2017

## 2017-02-22 NOTE — Progress Notes (Signed)
TR band checked at 1200, small amount of blood oozing, inflated 2 ml bleeding stopped. Total air 8 ml at this time. Deflation to begin again at 1230

## 2017-02-22 NOTE — Progress Notes (Signed)
TR band deflated 2 ml at 1100. Small amount of blood seen at right radial site. TR band re inflated 2 ml to 10 ml. Will continue to check site and attempt deflation at 1130

## 2017-02-22 NOTE — Progress Notes (Signed)
ANTICOAGULATION CONSULT NOTE  Pharmacy Consult for Heparin Drip Indication: chest pain/ACS  No Known Allergies  Patient Measurements: Height: 5\' 8"  (172.7 cm) Weight: 175 lb 14.4 oz (79.8 kg) IBW/kg (Calculated) : 68.4 Heparin Dosing Weight: 79 kg  Vital Signs: Temp: 97.9 F (36.6 C) (06/07 0355) Temp Source: Oral (06/07 0355) BP: 117/86 (06/07 0355) Pulse Rate: 85 (06/07 0355)  Labs:  Recent Labs  02/20/17 1120  02/20/17 1944 02/21/17 0143 02/21/17 1458 02/21/17 2138 02/22/17 0509  HGB 14.7  --   --  14.5  --   --  15.0  HCT 41.6  --   --  40.9  --   --  42.5  PLT 147*  --   --  144* 147*  --  151  APTT  --   --   --   --  30  --   --   LABPROT  --   --   --   --  13.9  --  14.6  INR  --   --   --   --  1.07  --  1.13  HEPARINUNFRC  --   --   --   --   --  0.63 0.74*  CREATININE 0.81  --   --  0.85  --   --  0.98  TROPONINI 0.04*  < > 0.04* 0.05* 0.03*  --   --   < > = values in this interval not displayed.  Estimated Creatinine Clearance: 72.7 mL/min (by C-G formula based on SCr of 0.98 mg/dL).   Medical History: Past Medical History:  Diagnosis Date  . Hepatitis C    a. followed at [facility]; b. s/p therapy  . HLD (hyperlipidemia)   . Hyperglycemia     Assessment: [name] with a history of hepatitis C admitted with CP. Patient received doses of subcutaneous heparin today for DVT prophylaxis.   Goal of Therapy:  Heparin level 0.3-0.7 units/ml Monitor platelets by anticoagulation protocol: Yes   Plan:  Will give half-bolus due to recent subcutaneous heparin administration.  Give 2000 units bolus x 1 Start heparin infusion at 950 units/hr Check anti-Xa level in 6 hours and daily while on heparin Continue to monitor H&H and platelets   6/7 @ 0500 HL 0.74 supratherapeutic. Will decrease rate to 850 units/hr and recheck HL @ 1100.  Thomasene Rippleavid Ramin Zoll, PharmD, BCPS Clinical Pharmacist 02/22/2017

## 2017-02-22 NOTE — H&P (View-Only) (Signed)
Cardiology Consultation Note  Patient ID: Jordan Wong, MRN: [id number], DOB/AGE: 1951-07-12 66 y.o. Admit date: 02/20/2017   Date of Consult: 02/21/2017 Primary Physician: Jerl Mina, MD Primary Cardiologist: new / consult  - Dr. Mariah Milling, MD Requesting Physician: Dr. Luberta Mutter, MD  Chief Complaint: Chest Pain  Reason for Consult: Unstable Angina  HPI: Jordan Wong is a 66 y.o. male who is being seen today for the evaluation of chest pain at the request of Dr. Cherlynn Kaiser, MD. Patient has a h/o Hep C s/p treatment at [facility].  Patient without any previously known cardiac history. Patient reports that he was brought to the ED on 6/6 by his wife due to c/o exertional CP that resolves with rest. Patient reports ~ 4 week history of exertional CP and chest tightness that has been associated with increased DOE. Patient has also noted an episode of chest pain that woke him up from sleep the prior week. Patient does not currently smoke or use alcohol, though does have a very remote history of tobacco use. Family history includes deceased mother, in her 56s due to liver cirrhosis, and deceased father, also in his 55's and due to asphyxiation from alcohol following the death of his wife (patient's mother). He is unable to provide family history of underlying cardiac problems for either of his parents.   Upon the patient's arrival to [facility] ED they were found to have BP 131/82, HR 63, temp 97.80F (36.4C), oxygen saturation 100% on room air, weight 79.9 kg (176 lb 1.6 oz). EKG showed NSR, PACs, left axis deviation, no acute st/t changes, CXR showed no acute cardiopulmonary disease. Labs showed flat trending troponin with a peak of 0.05, platelets at 147, and glucose at 111, SCr 0.81, K+ 4.0, UDS negative, ETOH negative, TSH normal. Patient underwent treadmill Myoview this morning per IM which was found to be high risk with significant ischemia in the septal, apical, distal anteroseptal wall concerning for hemodynamically  significant stenosis, EF estimated at 50%. Patient did note significant dyspnea with exercise with some associated chest tightness. Both improved during recovery. He is currently chest pain free.   Past Medical History:  Diagnosis Date  . Hepatitis C    a. [facility]; b. s/p therapy  . HLD (hyperlipidemia)   . Hyperglycemia        Most Recent Cardiac Studies: Treadmill Myoview 02/21/2017: Exercise myocardial perfusion imaging study with significant  Ischemia in the septal, apical, distal anteroseptal wall concerning for hemodynamically significant stenosis. Wall motion abnormality with stress noted in the regions detailed above,  EF estimated at 50% 1 mm ST depression with exercise noted, resolved at rest.  Abnormal study, High risk scan   Surgical History: History reviewed. No pertinent surgical history.   Home Meds: Prior to Admission medications   Medication Sig Start Date End Date Taking? Authorizing Provider  colchicine 0.6 MG tablet Take 0.6 mg by mouth daily as needed.  12/28/16  Yes [provider]  indomethacin (INDOCIN) 25 MG capsule Take 1 capsule (25 mg total) by mouth 2 (two) times daily with a meal. Patient not taking: Reported on 02/20/2017 02/22/16   Faythe Ghee, PA-C    Inpatient Medications:  . aspirin EC  81 mg Oral Daily  . docusate sodium  100 mg Oral BID  . enoxaparin (LOVENOX) injection  40 mg Subcutaneous Q24H     Allergies: No Known Allergies  Social History   Social History  . Marital status: Married    Spouse name:  N/A  . Number of children: N/A  . Years of education: N/A   Occupational History  . Not on file.   Social History Main Topics  . Smoking status: Former Games developermoker  . Smokeless tobacco: Never Used  . Alcohol use Not on file  . Drug use: No  . Sexual activity: Not on file   Other Topics Concern  . Not on file   Social History Narrative  . No narrative on file     Family History  Problem Relation Age of Onset  .  Alcohol abuse Mother   . Cirrhosis Mother        a. passed in her 6650s  . Psychiatric Illness Father        a. suicide following wife's death; b. 7050s     Review of Systems: Review of Systems  Constitutional: Positive for malaise/fatigue. Negative for chills, diaphoresis, fever and weight loss.  HENT: Negative for congestion.   Eyes: Negative for discharge and redness.  Respiratory: Positive for shortness of breath. Negative for cough, hemoptysis, sputum production and wheezing.   Cardiovascular: Positive for chest pain. Negative for palpitations, orthopnea, claudication, leg swelling and PND.  Gastrointestinal: Negative for abdominal pain, blood in stool, heartburn, melena, nausea and vomiting.  Genitourinary: Negative for hematuria.  Musculoskeletal: Negative for falls and myalgias.  Skin: Negative for rash.  Neurological: Negative for dizziness, tingling, tremors, sensory change, speech change, focal weakness, loss of consciousness and weakness.  Endo/Heme/Allergies: Does not bruise/bleed easily.  Psychiatric/Behavioral: Negative for substance abuse. The patient is not nervous/anxious.   All other systems reviewed and are negative.   Labs:  Recent Labs  02/20/17 1120 02/20/17 1512 02/20/17 1944 02/21/17 0143  TROPONINI 0.04* 0.05* 0.04* 0.05*   Lab Results  Component Value Date   WBC 8.3 02/21/2017   HGB 14.5 02/21/2017   HCT 40.9 02/21/2017   MCV 87.1 02/21/2017   PLT 144 (L) 02/21/2017     Recent Labs Lab 02/21/17 0143  NA 140  K 4.0  CL 106  CO2 28  BUN 15  CREATININE 0.85  CALCIUM 9.0  GLUCOSE 106*   Lab Results  Component Value Date   CHOL 175 02/21/2017   HDL 38 (L) 02/21/2017   LDLCALC 116 (H) 02/21/2017   TRIG 103 02/21/2017   No results found for: DDIMER  Radiology/Studies:  Dg Chest 2 View  Result Date: 02/20/2017 IMPRESSION: No acute cardiopulmonary disease. Electronically Signed   By: Hulan Saashomas  Lawrence M.D.   On: 02/20/2017 11:46   Nm  Myocar Multi W/spect W/wall Motion / Ef  Result Date: 02/21/2017 Exercise myocardial perfusion imaging study with significant  Ischemia in the septal, apical, distal anteroseptal wall concerning for hemodynamically significant stenosis. Wall motion abnormality with stress noted in the regions detailed above,  EF estimated at 50% 1 mm ST depression with exercise noted, resolved at rest. Abnormal study, High risk scan Signed, Dossie Arbourim Gollan, MD, Ph.D [facility]    EKG: Interpreted by me showed: NSR, PACs, left axis deviation, no acute st/t changes Telemetry: Interpreted by me showed: NSR  Weights: Filed Weights   02/20/17 1115 02/20/17 1455 02/21/17 0500  Weight: 180 lb (81.6 kg) 176 lb 1.6 oz (79.9 kg) 174 lb 1.6 oz (79 kg)     Physical Exam: Blood pressure 128/77, pulse 71, temperature 98.4 F (36.9 C), temperature source Oral, resp. rate 20, height 5\' 8"  (1.727 m), weight 174 lb 1.6 oz (79 kg), SpO2 97 %. Body mass index is 26.47  kg/m. General: Well developed, well nourished, in no acute distress. Head: Normocephalic, atraumatic, sclera non-icteric, no xanthomas, nares are without discharge. Right eye sclerotic.   Neck: Negative for carotid bruits. JVD not elevated. Lungs: Clear bilaterally to auscultation without wheezes, rales, or rhonchi. Breathing is unlabored. Heart: RRR with S1 S2. No murmurs, rubs, or gallops appreciated. Abdomen: Soft, non-tender, non-distended with normoactive bowel sounds. No hepatomegaly. No rebound/guarding. No obvious abdominal masses. Msk:  Strength and tone appear normal for age. Extremities: No clubbing or cyanosis. No edema. Distal pedal pulses are 2+ and equal bilaterally. Neuro: Alert and oriented X 3. No facial asymmetry. No focal deficit. Moves all extremities spontaneously. Psych:  Responds to questions appropriately with a normal affect.    Assessment and Plan:  Principal Problem:   Unstable angina (HCC) Active Problems:   Dyspnea   HLD  (hyperlipidemia)   Thrombocytopenia (HCC)   Hyperglycemia    1. Unstable angina/elevated troponin/NSTEMI/dyspnea:  -Presented with exertional chest pain as above, stress test was ordered by IM with cardiology to follow if abnormal -Currently without chest pain -Troponin minimally elevated with a peak of 0.05 -Treadmill Myoview high risk as above -Will plan for LHC with Dr. Kirke Corin, MD in the morning of 6/7 (case ~ 9:30 AM) -Risks and benefits of cardiac catheterization have been discussed with the patient including risks of bleeding, bruising, infection, kidney damage, stroke, heart attack, and death. The patient understands these risks and is willing to proceed with the procedure. All questions have been answered and concerns listened to -TTE pending -Start heparin gtt -Continue ASA -Start Coreg 3.125 mg bid with possible titration s/p LHC -Start Lipitor 40 mg daily -SL NTG prn -Cardiac rehab -Trend troponin this afternoon/evening  2. HLD: -Lipid panel with LDL 116 -Lipitor as above -Check HFP -Will need follow labs in 6-8 weeks  3. Hyperglycemia: -Check A1c  4. Thrombocytopenia: -Baseline appears to be ~160-180 -Current PLT 140s -Monitor on antiplatelet therapy -Will order platelet count to be drawn in a citrate tube (rather than typical EDTA tube) to evaluate for pseudothrombocytopenia (correction factor of 1.1)  5. Hepatitis C: -Per primary    Signed, Eula Listen, PA-C [facility] Pager: [phone] 02/21/2017, 2:27 PM

## 2017-02-22 NOTE — Interval H&P Note (Signed)
Cath Lab Visit (complete for each Cath Lab visit)  Clinical Evaluation Leading to the Procedure:   ACS: Yes.    Non-ACS:    Anginal Classification: CCS IV  Anti-ischemic medical therapy: Minimal Therapy (1 class of medications)  Non-Invasive Test Results: High-risk stress test findings: cardiac mortality >3%/year  Prior CABG: No previous CABG      History and Physical Interval Note:  02/22/2017 10:01 AM  Rosanne SackPaul Zarzycki  has presented today for surgery, with the diagnosis of unstable angina, positive nuclear stress test  The various methods of treatment have been discussed with the patient and family. After consideration of risks, benefits and other options for treatment, the patient has consented to  Procedure(s): Left Heart Cath and Coronary Angiography (N/A) as a surgical intervention .  The patient's history has been reviewed, patient examined, no change in status, stable for surgery.  I have reviewed the patient's chart and labs.  Questions were answered to the patient's satisfaction.     Lorine BearsMuhammad Ruari Duggan

## 2017-02-22 NOTE — H&P (Deleted)
  The note originally documented on this encounter has been moved the the encounter in which it belongs.  

## 2017-02-22 NOTE — Progress Notes (Signed)
ANTICOAGULATION CONSULT NOTE  Pharmacy Consult for Heparin Drip Indication: chest pain/ACS  No Known Allergies  Patient Measurements: Height: 5\' 9"  (175.3 cm) Weight: 185 lb 6.4 oz (84.1 kg) IBW/kg (Calculated) : 70.7 Heparin Dosing Weight: 79 kg  Vital Signs: Temp: 98.5 F (36.9 C) (06/07 1408) Temp Source: Oral (06/07 1408) BP: 126/73 (06/07 1408) Pulse Rate: 76 (06/07 1408)  Labs:  Recent Labs  02/20/17 1120  02/20/17 1944 02/21/17 0143 02/21/17 1458 02/21/17 2138 02/22/17 0509  HGB 14.7  --   --  14.5  --   --  15.0  HCT 41.6  --   --  40.9  --   --  42.5  PLT 147*  --   --  144* 147*  --  151  APTT  --   --   --   --  30  --   --   LABPROT  --   --   --   --  13.9  --  14.6  INR  --   --   --   --  1.07  --  1.13  HEPARINUNFRC  --   --   --   --   --  0.63 0.74*  CREATININE 0.81  --   --  0.85  --   --  0.98  TROPONINI 0.04*  < > 0.04* 0.05* 0.03*  --   --   < > = values in this interval not displayed.  Estimated Creatinine Clearance: 75.1 mL/min (by C-G formula based on SCr of 0.98 mg/dL).   Medical History: Past Medical History:  Diagnosis Date  . CAD (coronary artery disease)    a. 02/2017 MV: septal, apical, dist antsept ischemia, EF 50%, 1mm ST dep w/ exercise; b. 02/2017 Cath: LM nl, LAD 99p, 50d, D1 90ost, RI 95ost, LCX 864m/d, RCA 95p, EF 55-65%.  . Hepatitis C 2016   a. with hepatic fibrosis - followed [facility]; b. s/p therapy - Viekira Pak (ombitasvir, paritaprevir and ritonavir 12.5/75/50mg  plus dasabuvir 250mg ) + ribavirin x 12 wks as part of Prioritize study.  . History of blood transfusion 1987   "related to leg OR"  . History of echocardiogram    a. 02/2017 Echo: EF 55-60%, no rwma, mild MR, mildly dil LA.  Marland Kitchen. History of gout   . HLD (hyperlipidemia)   . Hyperglycemia   . Pneumonia 2000s X 1    Assessment: [name] with a history of hepatitis C admitted with CP. 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 Heparin to  resume 4 hours after TR band removal (~1600).   Goal of Therapy:  Heparin level 0.3-0.7 units/ml Monitor platelets by anticoagulation protocol: Yes   Plan:  Start heparin 850 units/hr at 2000 with no bolus F/u 6 hr heparin level at 0200 CABG in AM  Bayard HuggerMei Roiza Wiedel, PharmD, BCPS  Clinical Pharmacist  Pager: [phone]   02/22/2017

## 2017-02-22 NOTE — Progress Notes (Signed)
Right TR band deflated at 1250 by 23 ml to 4 ml. Slight ooze seen, re inflated to 6 ml. Small amount if oozing remained, inflated by 1 ml. Total amount of air 7 ml. [facility] at bedside 1300, updated on TR band deflation and Doctor, general practicehanna RN at [facility] called and updated as well

## 2017-02-22 NOTE — Progress Notes (Signed)
Pre-op Cardiac Surgery  Carotid Findings:  Findings suggest 1-39% internal carotid artery stenosis bilaterally. Vertebral arteries are patent with antegrade flow.  Upper Extremity Right Left  Brachial Pressures 109-Triphasic   Radial Waveforms Triphasic Triphasic  Ulnar Waveforms Triphasic Triphasic  Palmar Arch (Allen's Test) Within normal limits Within normal limits    Lower  Extremity Right Left  Dorsalis Pedis Triphasic Triphasic  Posterior Tibial Triphasic Triphasic   Bilateral pedal arteries are within normal limits.  02/22/2017 5:32 PM Jordan FeyMichelle Caili Wong, BS, RVT, RDCS, RDMS

## 2017-02-22 NOTE — Discharge Summary (Signed)
[employer] [facility] [place] [facility]   PATIENT NAME: Jordan Wong    MR#:  [id number]  DATE OF BIRTH:  24-Nov-1950  DATE OF ADMISSION:  02/20/2017 ADMITTING PHYSICIAN: Katha Hamming, MD  DATE OF DISCHARGE: 02/22/2017  PRIMARY CARE PHYSICIAN: Jerl Mina, MD    ADMISSION DIAGNOSIS:  NSTEMI (non-ST elevated myocardial infarction) (HCC) [I21.4] Chest pain [R07.9]  DISCHARGE DIAGNOSIS:  Principal Problem:   Unstable angina (HCC) Active Problems:   Hepatitis C   Dyspnea   HLD (hyperlipidemia)   Thrombocytopenia (HCC)   Hyperglycemia   CAD (coronary artery disease)   SECONDARY DIAGNOSIS:   Past Medical History:  Diagnosis Date  . CAD (coronary artery disease)    a. 02/2017 MV: septal, apical, dist antsept ischemia, EF 50%, 1mm ST dep w/ exercise; b. 02/2017 Cath: LM nl, LAD 99p, 50d, D1 90ost, RI 95ost, LCX 53m/d, RCA 95p, EF 55-65%.  . Hepatitis C    a. with hepatic fibrosis - [facility]; b. s/p therapy - Viekira Pak (ombitasvir, paritaprevir and ritonavir 12.5/75/50mg  plus dasabuvir 250mg ) + ribavirin x 12 wks as part of Prioritize study.  . History of echocardiogram    a. 02/2017 Echo: EF 55-60%, no rwma, mild MR, mildly dil LA.  Marland Kitchen HLD (hyperlipidemia)   . Hyperglycemia     HOSPITAL COURSE:   66 year old male with hx of Gout who presented to the hospital with exertional Chest pain.   1. CAD/Chest Pain-Patient presented to the hospital with chest pain worrisome for angina. Patient underwent a nuclear medicine stress test which was abnormal. Patient underwent cardiac catheterization which showed significant three-vessel coronary artery disease. -Patient is now being transferred to [facility] for evaluation for coronary artery bypass graft surgery. He is currently chest pain-free and hemodynamically stable. -Continue aspirin, Coreg, statin, nitroglycerin.  2. Gout-no acute attack presently. -Continue colchicine.  3. History of hepatitis  C-patient is status post treatment with antiviral therapy. He is currently in remission. No evidence of chronic liver disease presently.  DISCHARGE CONDITIONS:   Stable  CONSULTS OBTAINED:  Treatment Team:  Antonieta Iba, MD  DRUG ALLERGIES:  No Known Allergies  DISCHARGE MEDICATIONS:   Allergies as of 02/22/2017   No Known Allergies     Medication List    STOP taking these medications   indomethacin 25 MG capsule Commonly known as:  INDOCIN     TAKE these medications   acetaminophen 325 MG tablet Commonly known as:  TYLENOL Take 2 tablets (650 mg total) by mouth every 6 (six) hours as needed for mild pain (or Fever >/= 101).   aspirin 81 MG EC tablet Take 1 tablet (81 mg total) by mouth daily.   atorvastatin 40 MG tablet Commonly known as:  LIPITOR Take 1 tablet (40 mg total) by mouth daily at 6 PM.   carvedilol 3.125 MG tablet Commonly known as:  COREG Take 1 tablet (3.125 mg total) by mouth 2 (two) times daily with a meal.   colchicine 0.6 MG tablet Take 0.6 mg by mouth daily as needed.   nitroGLYCERIN 0.4 MG SL tablet Commonly known as:  NITROSTAT Place 1 tablet (0.4 mg total) under the tongue every 5 (five) minutes as needed for chest pain.   ondansetron 4 MG tablet Commonly known as:  ZOFRAN Take 1 tablet (4 mg total) by mouth every 6 (six) hours as needed for nausea.         DISCHARGE INSTRUCTIONS:   DIET:  Cardiac diet  DISCHARGE CONDITION:  Stable  ACTIVITY:  Activity as tolerated  OXYGEN:  Home Oxygen: No.   Oxygen Delivery: room air  [facility]  [facility]    If you experience worsening of your admission symptoms, develop shortness of breath, life threatening emergency, suicidal or homicidal thoughts you must seek medical attention immediately by calling 911 or calling your MD immediately  if symptoms less severe.  You Must read complete instructions/literature along with all the possible adverse  reactions/side effects for all the Medicines you take and that have been prescribed to you. Take any new Medicines after you have completely understood and accpet all the possible adverse reactions/side effects.   Please note  You were cared for by a hospitalist during your hospital stay. If you have any questions about your discharge medications or the care you received while you were in the hospital after you are discharged, you can call the unit and asked to speak with the hospitalist on call if the hospitalist that took care of you is not available. Once you are discharged, your primary care physician will handle any further medical issues. Please note that NO REFILLS for any discharge medications will be authorized once you are discharged, as it is imperative that you return to your primary care physician (or establish a relationship with a primary care physician if you do not have one) for your aftercare needs so that they can reassess your need for medications and monitor your lab values.     Today   No chest Pain.  Seen in Recovery after Cardiac cath. Family at bedside and questions answered.  No other complaints presently.   VITAL SIGNS:  Blood pressure 132/86, pulse 69, temperature 98.6 F (37 C), temperature source Oral, resp. rate 15, height 5\' 8"  (1.727 m), weight 79.8 kg (175 lb 14.4 oz), SpO2 97 %.  I/O:    Intake/Output Summary (Last 24 hours) at 02/22/17 1254 Last data filed at 02/22/17 0541  Gross per 24 hour  Intake              354 ml  Output                0 ml  Net              354 ml    PHYSICAL EXAMINATION:  GENERAL:  66 y.o.-year-old patient lying in the bed with no acute distress.  EYES: Pupils equal, round, reactive to light and accommodation. No scleral icterus. Extraocular muscles intact.  HEENT: Head atraumatic, normocephalic. Oropharynx and nasopharynx clear.  NECK:  Supple, no jugular venous distention. No thyroid enlargement, no tenderness.  LUNGS:  Normal breath sounds bilaterally, no wheezing, rales,rhonchi. No use of accessory muscles of respiration.  CARDIOVASCULAR: S1, S2 normal. No murmurs, rubs, or gallops.  ABDOMEN: Soft, non-tender, non-distended. Bowel sounds present. No organomegaly or mass.  EXTREMITIES: No pedal edema, cyanosis, or clubbing.  NEUROLOGIC: Cranial nerves II through XII are intact. No focal motor or sensory defecits b/l.  PSYCHIATRIC: The patient is alert and oriented x 3. Good affect.  SKIN: No obvious rash, lesion, or ulcer.   DATA REVIEW:   CBC  Recent Labs Lab 02/22/17 0509  WBC 7.9  HGB 15.0  HCT 42.5  PLT 151    Chemistries   Recent Labs Lab 02/21/17 1458 02/22/17 0509  NA  --  139  K  --  3.7  CL  --  105  CO2  --  26  GLUCOSE  --  116*  BUN  --  13  CREATININE  --  0.98  CALCIUM  --  9.1  AST 21  --   ALT 20  --   ALKPHOS 50  --   BILITOT 0.7  --     Cardiac Enzymes  Recent Labs Lab 02/21/17 1458  TROPONINI 0.03*    Microbiology Results  No results found for this or any previous visit.  RADIOLOGY:  Nm Myocar Multi W/spect W/wall Motion / Ef  Result Date: 02/21/2017 Exercise myocardial perfusion imaging study with significant  Ischemia in the septal, apical, distal anteroseptal wall concerning for hemodynamically significant stenosis. Wall motion abnormality with stress noted in the regions detailed above,  EF estimated at 50% 1 mm ST depression with exercise noted, resolved at rest. Abnormal study, High risk scan Signed, Dossie Arbour, MD, Ph.D [facility]      Management plans discussed with the patient, family and they are in agreement.  CODE STATUS:     Code Status Orders        Start     Ordered   02/22/17 1250  Full code  Continuous     02/22/17 1249    Code Status History    Date Active Date Inactive Code Status Order ID Comments User Context   02/20/2017  1:49 PM 02/22/2017 12:49 PM Full Code [id number]  Katha Hamming, MD ED      TOTAL TIME  TAKING CARE OF THIS PATIENT: 40 minutes.    Houston Siren M.D on 02/22/2017 at 12:54 PM  Between 7am to 6pm - Pager - [phone]  After 6pm go to [email or web address] - Social research officer, government  [facility] [place] [facility]  Office  [phone]  CC: Primary care physician; Jerl Mina, MD

## 2017-02-22 NOTE — Progress Notes (Signed)
Briefly discussed sternal precautions, mobility post op, d/c planning with pt and family. He is starting PFTs so I left IS for respiratory therapist to discuss. I left [facility] booklet, careguide, and video to watch. Pt and family voiced understanding.  [id number] Ethelda ChickKristan Gabrelle Roca CES, ACSM 2:34 PM 02/22/2017
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
## 2017-02-22 NOTE — H&P (Signed)
History & Physical    Patient ID: Jordan Wong MRN: [id number], DOB/AGE: 12-12-1950   Admit date: 02/20/2017   Primary Physician: Jerl Mina, MD Primary Cardiologist: Concha Se, MD   Patient Profile    66 y/o ? with a h/o Hep C and hepatic cirrhosis followed @ [facility], who was admitted to [facility] on 6/5 with chest pain and mild troponin elevation, had an abnl stress test followed by cath on 6/7, which revealed severe 3VD, who is being transferred to [facility] for cardiothoracic surgical evaluation.  Past Medical History    Past Medical History:  Diagnosis Date  . CAD (coronary artery disease)    a. 02/2017 MV: septal, apical, dist antsept ischemia, EF 50%, 1mm ST dep w/ exercise; b. 02/2017 Cath: LM nl, LAD 99p, 50d, D1 90ost, RI 95ost, LCX 34m/d, RCA 95p, EF 55-65%.  . Hepatitis C    a. with hepatic fibrosis - [facility]; b. s/p therapy - Viekira Pak (ombitasvir, paritaprevir and ritonavir 12.5/75/50mg  plus dasabuvir 250mg ) + ribavirin x 12 wks as part of Prioritize study.  . History of echocardiogram    a. 02/2017 Echo: EF 55-60%, no rwma, mild MR, mildly dil LA.  Marland Kitchen HLD (hyperlipidemia)   . Hyperglycemia     History reviewed. No pertinent surgical history.   Allergies  No Known Allergies  History of Present Illness    66 y/o ? with a h/o Hep C and hepatic fibrosis followed @ [facility].  He had no prior cardiac history.  He presented to [facility] on 6/5 due to a 4 wk h/o progressive exertional chest tightness associated with dyspnea.  He had also had an episode of nocturnal angina.  In the ED, ECG was non-acute.  Troponin was mildly elevated @ 0.05.  He was admitted by IM.  Troponin trend remained flat.  He underwent stress testing that showed septal, apical, and distal anteroseptal ischemia.  Echo showed nl EF.  Cardiology was consulted on 6/6 and he underwent diagnostic catheterization on the morning of 6/7, revealing severe multivessel CAD w/ nl LV fxn.  As a result, transfer to [facility]  has been arranged.   Inpt Medications - [facility]    . [MAR Hold] aspirin EC  81 mg Oral Daily  . [MAR Hold] atorvastatin  40 mg Oral q1800  . [MAR Hold] carvedilol  3.125 mg Oral BID WC  . [MAR Hold] docusate sodium  100 mg Oral BID  . sodium chloride flush  3 mL Intravenous Q12H    Family History    Family History  Problem Relation Age of Onset  . Alcohol abuse Mother   . Cirrhosis Mother        a. passed in her 79s  . Psychiatric Illness Father        a. suicide following wife's death; b. 89s    Social History    Social History   Social History  . Marital status: Married    Spouse name: N/A  . Number of children: N/A  . Years of education: N/A   Occupational History  . Not on file.   Social History Main Topics  . Smoking status: Former Games developer  . Smokeless tobacco: Never Used  . Alcohol use Not on file  . Drug use: No  . Sexual activity: Not on file   Other Topics Concern  . Not on file   Social History Narrative  . No narrative on file     Review of Systems  General:  No chills, fever, night sweats or weight changes.  Cardiovascular:  +++ chest pain, +++dyspnea on exertion, no edema, orthopnea, palpitations, paroxysmal nocturnal dyspnea. Dermatological: No rash, lesions/masses Respiratory: No cough, dyspnea Urologic: No hematuria, dysuria Abdominal:   No nausea, vomiting, diarrhea, bright red blood per rectum, melena, or hematemesis Neurologic:  No visual changes, wkns, changes in mental status. All other systems reviewed and are otherwise negative except as noted above.  Physical Exam    Blood pressure 120/75, pulse 65, temperature 98.6 F (37 C), temperature source Oral, resp. rate 15, height 5\' 8"  (1.727 m), weight 175 lb 14.4 oz (79.8 kg), SpO2 95 %.  General: Pleasant, NAD Psych: Normal affect. Neuro: Alert and oriented X 3. Moves all extremities spontaneously. HEENT: Normal  Neck: Supple without bruits or JVD. Lungs:  Resp regular and  unlabored, CTA. Heart: RRR no s3, s4, or murmurs. Abdomen: Soft, non-tender, non-distended, BS + x 4.  Extremities: No clubbing, cyanosis or edema. DP/PT/Radials 2+ and equal bilaterally.  Labs     Recent Labs  02/20/17 1512 02/20/17 1944 02/21/17 0143 02/21/17 1458  TROPONINI 0.05* 0.04* 0.05* 0.03*   Lab Results  Component Value Date   WBC 7.9 02/22/2017   HGB 15.0 02/22/2017   HCT 42.5 02/22/2017   MCV 88.2 02/22/2017   PLT 151 02/22/2017     Recent Labs Lab 02/21/17 1458 02/22/17 0509  NA  --  139  K  --  3.7  CL  --  105  CO2  --  26  BUN  --  13  CREATININE  --  0.98  CALCIUM  --  9.1  PROT 7.3  --   BILITOT 0.7  --   ALKPHOS 50  --   ALT 20  --   AST 21  --   GLUCOSE  --  116*   Lab Results  Component Value Date   CHOL 175 02/21/2017   HDL 38 (L) 02/21/2017   LDLCALC 116 (H) 02/21/2017   TRIG 103 02/21/2017    Radiology Studies    Dg Chest 2 View  Result Date: 02/20/2017 CLINICAL DATA:  [age] year old presenting with a 3-4 week history of intermittent mid chest pain. No associated shortness of breath. Former smoker. EXAM: CHEST  2 VIEW COMPARISON:  None. FINDINGS: Cardiomediastinal silhouette unremarkable. Lungs clear. Bronchovascular markings normal. Pulmonary vascularity normal. No visible pleural effusions. No pneumothorax. Degenerative changes involving the thoracic and upper lumbar spine. IMPRESSION: No acute cardiopulmonary disease. Electronically Signed   By: Hulan Saashomas  Lawrence M.D.   On: 02/20/2017 11:46   Nm Myocar Multi W/spect W/wall Motion / Ef  Result Date: 02/21/2017 Exercise myocardial perfusion imaging study with significant  Ischemia in the septal, apical, distal anteroseptal wall concerning for hemodynamically significant stenosis. Wall motion abnormality with stress noted in the regions detailed above,  EF estimated at 50% 1 mm ST depression with exercise noted, resolved at rest. Abnormal study, High risk scan Signed, Dossie Arbourim Gollan, MD, Ph.D  [facility]    ECG & Cardiac Imaging    6/5 - RSR, 66, LAD, LAFB, no acute st/t changes  Assessment & Plan    1.  USA/Demand Ischemia/CAD:  Pt admitted 6/5 with a 4 wk h/o progressive exertional c/p and dyspnea.  He had very mild troponin elevation with flat trend.  Stress testing showed severe ischemia involving the septal, apical, and distal anteroseptal walls.  Echo showed nl EF. Cath this am showed severe multivessel CAD.  He is currently c/p  free.  He will be transferred to [facility] for further evaluation and cardiothoracic surgical consultation.  Plan to resume asa,  blocker, and high potency statin therapy.  2.  HL:  LDL 116.  Cont statin therapy in setting of above.  Follow lft's closely given h/o Hep C and hepatic fibrosis.  3.  Hep C/Hepatic Fibrosis:  Followed @ [facility].  Follow LFTs in setting of statin therapy.  4.  Hyperglycemia:  BG noted to be mildly elevated during admission (116 fasting this am).  A1c only 4.9.  Signed, Nicolasa Ducking, NP 02/22/2017, 12:00 PM

## 2017-02-23 ENCOUNTER — Inpatient Hospital Stay (HOSPITAL_COMMUNITY): Payer: Medicare HMO | Admitting: Certified Registered Nurse Anesthetist

## 2017-02-23 ENCOUNTER — Encounter (HOSPITAL_COMMUNITY)
Admission: AD | Disposition: A | Discharge status: Home / Self Care | Payer: Self-pay | Source: Other Acute Inpatient Hospital | Attending: Surgery

## 2017-02-23 ENCOUNTER — Inpatient Hospital Stay (HOSPITAL_COMMUNITY): Payer: Medicare HMO

## 2017-02-23 DIAGNOSIS — Z951 Presence of aortocoronary bypass graft: Secondary | ICD-10-CM

## 2017-02-23 DIAGNOSIS — I2510 Atherosclerotic heart disease of native coronary artery without angina pectoris: Secondary | ICD-10-CM

## 2017-02-23 DIAGNOSIS — I25110 Atherosclerotic heart disease of native coronary artery with unstable angina pectoris: Secondary | ICD-10-CM

## 2017-02-23 HISTORY — PX: CORONARY ARTERY BYPASS GRAFT: SHX141

## 2017-02-23 HISTORY — PX: TEE WITHOUT CARDIOVERSION: SHX5443

## 2017-02-23 LAB — BASIC METABOLIC PANEL
Anion gap: 7 (ref 5–15)
BUN: 8 mg/dL (ref 6–20)
CO2: 27 mmol/L (ref 22–32)
CREATININE: 0.93 mg/dL (ref 0.61–1.24)
Calcium: 8.8 mg/dL — ABNORMAL LOW (ref 8.9–10.3)
Chloride: 105 mmol/L (ref 101–111)
GFR calc Af Amer: >60 mL/min (ref >60)
GLUCOSE: 110 mg/dL — AB (ref 65–99)
POTASSIUM: 4 mmol/L (ref 3.5–5.1)
SODIUM: 139 mmol/L (ref 135–145)

## 2017-02-23 LAB — POCT I-STAT, CHEM 8
BUN: 9 mg/dL (ref 6–20)
BUN: 7 mg/dL (ref 6–20)
BUN: 9 mg/dL (ref 6–20)
BUN: 7 mg/dL (ref 6–20)
BUN: 8 mg/dL (ref 6–20)
BUN: 7 mg/dL (ref 6–20)
CALCIUM ION: 1.3 mmol/L (ref 1.15–1.40)
CALCIUM ION: 0.9 mmol/L — AB (ref 1.15–1.40)
CALCIUM ION: 1.26 mmol/L (ref 1.15–1.40)
CHLORIDE: 103 mmol/L (ref 101–111)
CREATININE: 0.6 mg/dL — AB (ref 0.61–1.24)
CREATININE: 0.7 mg/dL (ref 0.61–1.24)
Calcium, Ion: 0.99 mmol/L — ABNORMAL LOW (ref 1.15–1.40)
Calcium, Ion: 1.02 mmol/L — ABNORMAL LOW (ref 1.15–1.40)
Calcium, Ion: 1.11 mmol/L — ABNORMAL LOW (ref 1.15–1.40)
Chloride: 103 mmol/L (ref 101–111)
Chloride: 99 mmol/L — ABNORMAL LOW (ref 101–111)
Chloride: 101 mmol/L (ref 101–111)
Chloride: 102 mmol/L (ref 101–111)
Chloride: 103 mmol/L (ref 101–111)
Creatinine, Ser: 0.6 mg/dL — ABNORMAL LOW (ref 0.61–1.24)
Creatinine, Ser: 0.4 mg/dL — ABNORMAL LOW (ref 0.61–1.24)
Creatinine, Ser: 0.6 mg/dL — ABNORMAL LOW (ref 0.61–1.24)
Creatinine, Ser: 0.6 mg/dL — ABNORMAL LOW (ref 0.61–1.24)
GLUCOSE: 112 mg/dL — AB (ref 65–99)
GLUCOSE: 146 mg/dL — AB (ref 65–99)
GLUCOSE: 136 mg/dL — AB (ref 65–99)
Glucose, Bld: 125 mg/dL — ABNORMAL HIGH (ref 65–99)
Glucose, Bld: 98 mg/dL (ref 65–99)
Glucose, Bld: 140 mg/dL — ABNORMAL HIGH (ref 65–99)
HCT: 36 % — ABNORMAL LOW (ref 39.0–52.0)
HCT: 26 % — ABNORMAL LOW (ref 39.0–52.0)
HCT: 35 % — ABNORMAL LOW (ref 39.0–52.0)
HCT: 29 % — ABNORMAL LOW (ref 39.0–52.0)
HEMATOCRIT: 27 % — AB (ref 39.0–52.0)
HEMATOCRIT: 33 % — AB (ref 39.0–52.0)
HEMOGLOBIN: 11.9 g/dL — AB (ref 13.0–17.0)
HEMOGLOBIN: 8.8 g/dL — AB (ref 13.0–17.0)
HEMOGLOBIN: 9.2 g/dL — AB (ref 13.0–17.0)
HEMOGLOBIN: 11.2 g/dL — AB (ref 13.0–17.0)
Hemoglobin: 12.2 g/dL — ABNORMAL LOW (ref 13.0–17.0)
Hemoglobin: 9.9 g/dL — ABNORMAL LOW (ref 13.0–17.0)
POTASSIUM: 5.8 mmol/L — AB (ref 3.5–5.1)
Potassium: 4 mmol/L (ref 3.5–5.1)
Potassium: 4.3 mmol/L (ref 3.5–5.1)
Potassium: 4.4 mmol/L (ref 3.5–5.1)
Potassium: 6.1 mmol/L — ABNORMAL HIGH (ref 3.5–5.1)
Potassium: 4 mmol/L (ref 3.5–5.1)
SODIUM: 139 mmol/L (ref 135–145)
SODIUM: 140 mmol/L (ref 135–145)
Sodium: 141 mmol/L (ref 135–145)
Sodium: 135 mmol/L (ref 135–145)
Sodium: 135 mmol/L (ref 135–145)
Sodium: 140 mmol/L (ref 135–145)
TCO2: 29 mmol/L (ref 0–100)
TCO2: 29 mmol/L (ref 0–100)
TCO2: 30 mmol/L (ref 0–100)
TCO2: 27 mmol/L (ref 0–100)
TCO2: 27 mmol/L (ref 0–100)
TCO2: 25 mmol/L (ref 0–100)

## 2017-02-23 LAB — POCT I-STAT 3, ART BLOOD GAS (G3+)
ACID-BASE DEFICIT: 2 mmol/L (ref 0.0–2.0)
ACID-BASE DEFICIT: 3 mmol/L — AB (ref 0.0–2.0)
ACID-BASE EXCESS: 2 mmol/L (ref 0.0–2.0)
ACID-BASE EXCESS: 1 mmol/L (ref 0.0–2.0)
Acid-Base Excess: 5 mmol/L — ABNORMAL HIGH (ref 0.0–2.0)
Acid-base deficit: 1 mmol/L (ref 0.0–2.0)
BICARBONATE: 28.7 mmol/L — AB (ref 20.0–28.0)
BICARBONATE: 25.3 mmol/L (ref 20.0–28.0)
BICARBONATE: 24 mmol/L (ref 20.0–28.0)
BICARBONATE: 26.3 mmol/L (ref 20.0–28.0)
Bicarbonate: 28 mmol/L (ref 20.0–28.0)
Bicarbonate: 22.9 mmol/L (ref 20.0–28.0)
O2 SAT: 100 %
O2 SAT: 97 %
O2 SAT: 97 %
O2 SAT: 96 %
O2 Saturation: 100 %
O2 Saturation: 98 %
PCO2 ART: 42.7 mmHg (ref 32.0–48.0)
PCO2 ART: 41.9 mmHg (ref 32.0–48.0)
PH ART: 7.331 — AB (ref 7.350–7.450)
PO2 ART: 549 mmHg — AB (ref 83.0–108.0)
PO2 ART: 119 mmHg — AB (ref 83.0–108.0)
PO2 ART: 88 mmHg (ref 83.0–108.0)
PO2 ART: 94 mmHg (ref 83.0–108.0)
Patient temperature: 36.4
TCO2: 29 mmol/L (ref 0–100)
TCO2: 30 mmol/L (ref 0–100)
TCO2: 27 mmol/L (ref 0–100)
TCO2: 25 mmol/L (ref 0–100)
TCO2: 28 mmol/L (ref 0–100)
TCO2: 24 mmol/L (ref 0–100)
pCO2 arterial: 50 mmHg — ABNORMAL HIGH (ref 32.0–48.0)
pCO2 arterial: 39.1 mmHg (ref 32.0–48.0)
pCO2 arterial: 47.8 mmHg (ref 32.0–48.0)
pCO2 arterial: 41.6 mmHg (ref 32.0–48.0)
pH, Arterial: 7.356 (ref 7.350–7.450)
pH, Arterial: 7.473 — ABNORMAL HIGH (ref 7.350–7.450)
pH, Arterial: 7.355 (ref 7.350–7.450)
pH, Arterial: 7.407 (ref 7.350–7.450)
pH, Arterial: 7.346 — ABNORMAL LOW (ref 7.350–7.450)
pO2, Arterial: 420 mmHg — ABNORMAL HIGH (ref 83.0–108.0)
pO2, Arterial: 87 mmHg (ref 83.0–108.0)

## 2017-02-23 LAB — CBC
HCT: 39.7 % (ref 39.0–52.0)
HEMATOCRIT: 34.9 % — AB (ref 39.0–52.0)
HEMATOCRIT: 33.3 % — AB (ref 39.0–52.0)
HEMOGLOBIN: 11.5 g/dL — AB (ref 13.0–17.0)
Hemoglobin: 13.7 g/dL (ref 13.0–17.0)
Hemoglobin: 12.1 g/dL — ABNORMAL LOW (ref 13.0–17.0)
MCH: 30.3 pg (ref 26.0–34.0)
MCH: 30.4 pg (ref 26.0–34.0)
MCH: 30.3 pg (ref 26.0–34.0)
MCHC: 34.5 g/dL (ref 30.0–36.0)
MCHC: 34.7 g/dL (ref 30.0–36.0)
MCHC: 34.5 g/dL (ref 30.0–36.0)
MCV: 87.8 fL (ref 78.0–100.0)
MCV: 87.7 fL (ref 78.0–100.0)
MCV: 87.9 fL (ref 78.0–100.0)
PLATELETS: 134 10*3/uL — AB (ref 150–400)
Platelets: 126 10*3/uL — ABNORMAL LOW (ref 150–400)
Platelets: 106 10*3/uL — ABNORMAL LOW (ref 150–400)
RBC: 4.52 MIL/uL (ref 4.22–5.81)
RBC: 3.98 MIL/uL — AB (ref 4.22–5.81)
RBC: 3.79 MIL/uL — AB (ref 4.22–5.81)
RDW: 13.9 % (ref 11.5–15.5)
RDW: 13.8 % (ref 11.5–15.5)
RDW: 13.9 % (ref 11.5–15.5)
WBC: 7.3 10*3/uL (ref 4.0–10.5)
WBC: 14.8 10*3/uL — AB (ref 4.0–10.5)
WBC: 11.4 10*3/uL — ABNORMAL HIGH (ref 4.0–10.5)

## 2017-02-23 LAB — HEMOGLOBIN AND HEMATOCRIT, BLOOD
HCT: 32.1 % — ABNORMAL LOW (ref 39.0–52.0)
HEMOGLOBIN: 11 g/dL — AB (ref 13.0–17.0)

## 2017-02-23 LAB — GLUCOSE, CAPILLARY
GLUCOSE-CAPILLARY: 126 mg/dL — AB (ref 65–99)
GLUCOSE-CAPILLARY: 127 mg/dL — AB (ref 65–99)
GLUCOSE-CAPILLARY: 177 mg/dL — AB (ref 65–99)
GLUCOSE-CAPILLARY: 146 mg/dL — AB (ref 65–99)
Glucose-Capillary: 109 mg/dL — ABNORMAL HIGH (ref 65–99)
Glucose-Capillary: 124 mg/dL — ABNORMAL HIGH (ref 65–99)
Glucose-Capillary: 128 mg/dL — ABNORMAL HIGH (ref 65–99)
Glucose-Capillary: 150 mg/dL — ABNORMAL HIGH (ref 65–99)

## 2017-02-23 LAB — POCT I-STAT 4, (NA,K, GLUC, HGB,HCT)
Glucose, Bld: 118 mg/dL — ABNORMAL HIGH (ref 65–99)
HCT: 32 % — ABNORMAL LOW (ref 39.0–52.0)
HEMOGLOBIN: 10.9 g/dL — AB (ref 13.0–17.0)
POTASSIUM: 4 mmol/L (ref 3.5–5.1)
Sodium: 142 mmol/L (ref 135–145)

## 2017-02-23 LAB — PROTIME-INR
INR: 1.3
Prothrombin Time: 16.3 seconds — ABNORMAL HIGH (ref 11.4–15.2)

## 2017-02-23 LAB — PLATELET COUNT: Platelets: 127 10*3/uL — ABNORMAL LOW (ref 150–400)

## 2017-02-23 LAB — MAGNESIUM: Magnesium: 3 mg/dL — ABNORMAL HIGH (ref 1.7–2.4)

## 2017-02-23 LAB — APTT: APTT: 31 s (ref 24–36)

## 2017-02-23 LAB — CREATININE, SERUM
CREATININE: 0.77 mg/dL (ref 0.61–1.24)
GFR calc non Af Amer: >60 mL/min (ref >60)

## 2017-02-23 LAB — HEPARIN LEVEL (UNFRACTIONATED): Heparin Unfractionated: 0.2 IU/mL — ABNORMAL LOW (ref 0.30–0.70)

## 2017-02-23 SURGERY — CORONARY ARTERY BYPASS GRAFTING (CABG)
Anesthesia: General | Site: Chest

## 2017-02-23 MED ORDER — SODIUM CHLORIDE 0.9 % IV SOLN: 250.0000 mL | INTRAVENOUS | Status: DC

## 2017-02-23 MED ORDER — FENTANYL CITRATE (PF) 250 MCG/5ML IJ SOLN
INTRAMUSCULAR | Status: DC | PRN
  Administered 2017-02-23: 500 ug via INTRAVENOUS
  Administered 2017-02-23: 50 ug via INTRAVENOUS
  Administered 2017-02-23: 150 ug via INTRAVENOUS
  Administered 2017-02-23: 100 ug via INTRAVENOUS
  Administered 2017-02-23 (×3): 50 ug via INTRAVENOUS
  Administered 2017-02-23: 100 ug via INTRAVENOUS
  Administered 2017-02-23 (×3): 50 ug via INTRAVENOUS
  Administered 2017-02-23: 200 ug via INTRAVENOUS
  Administered 2017-02-23: 100 ug via INTRAVENOUS

## 2017-02-23 MED ORDER — METOPROLOL TARTRATE 25 MG/10 ML ORAL SUSPENSION: 12.5000 mg | Freq: Two times a day (BID) | ORAL | Status: DC

## 2017-02-23 MED ORDER — PANTOPRAZOLE SODIUM 40 MG PO TBEC: 40.0000 mg | DELAYED_RELEASE_TABLET | Freq: Every day | ORAL | Status: DC

## 2017-02-23 MED ORDER — PROPOFOL 10 MG/ML IV BOLUS
INTRAVENOUS | Status: DC | PRN
  Administered 2017-02-23: 50 mg via INTRAVENOUS
  Administered 2017-02-23: 30 mg via INTRAVENOUS

## 2017-02-23 MED ORDER — SODIUM CHLORIDE 0.9 % IV SOLN
0.0000 ug/kg/h | INTRAVENOUS | Status: DC
  Administered 2017-02-23: 0.5 ug/kg/h via INTRAVENOUS
  Filled 2017-02-23: qty 2

## 2017-02-23 MED ORDER — SODIUM CHLORIDE 0.9 % IV SOLN
INTRAVENOUS | Status: DC | PRN
  Administered 2017-02-23: 13:00:00 via INTRAVENOUS

## 2017-02-23 MED ORDER — ASPIRIN 81 MG PO CHEW: 324.0000 mg | CHEWABLE_TABLET | Freq: Every day | ORAL | Status: DC

## 2017-02-23 MED ORDER — CALCIUM CHLORIDE 10 % IV SOLN
INTRAVENOUS | Status: AC
  Filled 2017-02-23: qty 10

## 2017-02-23 MED ORDER — LACTATED RINGERS IV SOLN
INTRAVENOUS | Status: DC
  Administered 2017-02-23: 15:00:00 via INTRAVENOUS

## 2017-02-23 MED ORDER — LACTATED RINGERS IV SOLN: 500.0000 mL | Freq: Once | INTRAVENOUS | Status: DC | PRN

## 2017-02-23 MED ORDER — ONDANSETRON HCL 4 MG/2ML IJ SOLN
4.0000 mg | Freq: Four times a day (QID) | INTRAMUSCULAR | Status: DC | PRN
  Administered 2017-02-24: 4 mg via INTRAVENOUS
  Filled 2017-02-23: qty 2

## 2017-02-23 MED ORDER — FENTANYL CITRATE (PF) 100 MCG/2ML IJ SOLN: INTRAMUSCULAR | Status: DC | PRN

## 2017-02-23 MED ORDER — LACTATED RINGERS IV SOLN
INTRAVENOUS | Status: DC | PRN
  Administered 2017-02-23: 08:00:00 via INTRAVENOUS

## 2017-02-23 MED ORDER — PROTAMINE SULFATE 10 MG/ML IV SOLN
INTRAVENOUS | Status: AC
  Filled 2017-02-23: qty 25

## 2017-02-23 MED ORDER — SODIUM CHLORIDE 0.9 % IV SOLN
0.0000 ug/min | INTRAVENOUS | Status: DC
  Administered 2017-02-23: 20 ug/min via INTRAVENOUS
  Filled 2017-02-23: qty 2

## 2017-02-23 MED ORDER — THROMBIN 20000 UNITS EX SOLR
CUTANEOUS | Status: AC
  Filled 2017-02-23: qty 20000

## 2017-02-23 MED ORDER — TRAMADOL HCL 50 MG PO TABS
50.0000 mg | ORAL_TABLET | ORAL | Status: DC | PRN
  Administered 2017-02-24 (×2): 100 mg via ORAL
  Filled 2017-02-23 (×2): qty 2

## 2017-02-23 MED ORDER — ATORVASTATIN CALCIUM 40 MG PO TABS
40.0000 mg | ORAL_TABLET | Freq: Every day | ORAL | Status: DC
  Administered 2017-02-24 – 2017-02-26 (×3): 40 mg via ORAL
  Filled 2017-02-23 (×4): qty 1

## 2017-02-23 MED ORDER — MORPHINE SULFATE (PF) 2 MG/ML IV SOLN: 2.0000 mg | INTRAVENOUS | Status: DC | PRN

## 2017-02-23 MED ORDER — CHLORHEXIDINE GLUCONATE 0.12 % MT SOLN
15.0000 mL | OROMUCOSAL | Status: AC
  Administered 2017-02-23: 15 mL via OROMUCOSAL

## 2017-02-23 MED ORDER — DOCUSATE SODIUM 100 MG PO CAPS
200.0000 mg | ORAL_CAPSULE | Freq: Every day | ORAL | Status: DC
  Administered 2017-02-24: 200 mg via ORAL
  Filled 2017-02-23: qty 2

## 2017-02-23 MED ORDER — ACETAMINOPHEN 160 MG/5ML PO SOLN: 650.0000 mg | Freq: Once | ORAL | Status: AC

## 2017-02-23 MED ORDER — SODIUM CHLORIDE 0.9% FLUSH
3.0000 mL | Freq: Two times a day (BID) | INTRAVENOUS | Status: DC
  Administered 2017-02-24 (×2): 3 mL via INTRAVENOUS

## 2017-02-23 MED ORDER — LACTATED RINGERS IV SOLN
INTRAVENOUS | Status: DC | PRN
  Administered 2017-02-23: 07:00:00 via INTRAVENOUS

## 2017-02-23 MED ORDER — MIDAZOLAM HCL 5 MG/5ML IJ SOLN
INTRAMUSCULAR | Status: DC | PRN
Start: 2017-02-23 — End: 2017-02-23
  Administered 2017-02-23 (×5): 1 mg via INTRAVENOUS
  Administered 2017-02-23: 2 mg via INTRAVENOUS
  Administered 2017-02-23: 1 mg via INTRAVENOUS
  Administered 2017-02-23: 2 mg via INTRAVENOUS

## 2017-02-23 MED ORDER — INSULIN REGULAR BOLUS VIA INFUSION
0.0000 [IU] | Freq: Three times a day (TID) | INTRAVENOUS | Status: DC
  Administered 2017-02-24: 2 [IU] via INTRAVENOUS
  Administered 2017-02-24: 4 [IU] via INTRAVENOUS
  Filled 2017-02-23: qty 10

## 2017-02-23 MED ORDER — OXYCODONE HCL 5 MG PO TABS
5.0000 mg | ORAL_TABLET | ORAL | Status: DC | PRN
  Administered 2017-02-24: 5 mg via ORAL
  Administered 2017-02-24: 10 mg via ORAL
  Filled 2017-02-23: qty 1
  Filled 2017-02-23: qty 2

## 2017-02-23 MED ORDER — SODIUM CHLORIDE 0.9% FLUSH: 3.0000 mL | INTRAVENOUS | Status: DC | PRN

## 2017-02-23 MED ORDER — PROPOFOL 10 MG/ML IV BOLUS
INTRAVENOUS | Status: AC
Start: 2017-02-23 — End: 2017-02-23
  Filled 2017-02-23: qty 20

## 2017-02-23 MED ORDER — NITROGLYCERIN IN D5W 200-5 MCG/ML-% IV SOLN: 0.0000 ug/min | INTRAVENOUS | Status: DC

## 2017-02-23 MED ORDER — ALBUMIN HUMAN 5 % IV SOLN
250.0000 mL | INTRAVENOUS | Status: AC | PRN
  Administered 2017-02-23 (×2): 250 mL via INTRAVENOUS

## 2017-02-23 MED ORDER — FENTANYL CITRATE (PF) 250 MCG/5ML IJ SOLN
INTRAMUSCULAR | Status: AC
  Filled 2017-02-23: qty 5

## 2017-02-23 MED ORDER — ALBUMIN HUMAN 5 % IV SOLN
INTRAVENOUS | Status: DC | PRN
  Administered 2017-02-23: 13:00:00 via INTRAVENOUS

## 2017-02-23 MED ORDER — VANCOMYCIN HCL IN DEXTROSE 1-5 GM/200ML-% IV SOLN
1000.0000 mg | Freq: Once | INTRAVENOUS | Status: AC
  Administered 2017-02-23: 1000 mg via INTRAVENOUS
  Filled 2017-02-23: qty 200

## 2017-02-23 MED ORDER — VECURONIUM BROMIDE 10 MG IV SOLR
INTRAVENOUS | Status: DC | PRN
  Administered 2017-02-23 (×4): 5 mg via INTRAVENOUS

## 2017-02-23 MED ORDER — THROMBIN 20000 UNITS EX SOLR
CUTANEOUS | Status: DC | PRN
  Administered 2017-02-23: 20000 [IU] via TOPICAL

## 2017-02-23 MED ORDER — MORPHINE SULFATE (PF) 4 MG/ML IV SOLN: 1.0000 mg | INTRAVENOUS | Status: AC | PRN

## 2017-02-23 MED ORDER — FAMOTIDINE IN NACL 20-0.9 MG/50ML-% IV SOLN
20.0000 mg | Freq: Two times a day (BID) | INTRAVENOUS | Status: AC
  Administered 2017-02-23: 20 mg via INTRAVENOUS

## 2017-02-23 MED ORDER — HEPARIN SODIUM (PORCINE) 1000 UNIT/ML IJ SOLN
INTRAMUSCULAR | Status: DC | PRN
  Administered 2017-02-23: 22000 [IU] via INTRAVENOUS

## 2017-02-23 MED ORDER — MIDAZOLAM HCL 2 MG/2ML IJ SOLN: 2.0000 mg | INTRAMUSCULAR | Status: DC | PRN

## 2017-02-23 MED ORDER — METOPROLOL TARTRATE 12.5 MG HALF TABLET: 12.5000 mg | ORAL_TABLET | Freq: Two times a day (BID) | ORAL | Status: DC

## 2017-02-23 MED ORDER — BISACODYL 5 MG PO TBEC
10.0000 mg | DELAYED_RELEASE_TABLET | Freq: Every day | ORAL | Status: DC
  Administered 2017-02-24: 10 mg via ORAL
  Filled 2017-02-23: qty 2

## 2017-02-23 MED ORDER — POTASSIUM CHLORIDE 10 MEQ/50ML IV SOLN: 10.0000 meq | INTRAVENOUS | Status: AC

## 2017-02-23 MED ORDER — VECURONIUM BROMIDE 10 MG IV SOLR
INTRAVENOUS | Status: AC
  Filled 2017-02-23: qty 10

## 2017-02-23 MED ORDER — SODIUM CHLORIDE 0.45 % IV SOLN
INTRAVENOUS | Status: DC | PRN
  Administered 2017-02-23: 15:00:00 via INTRAVENOUS

## 2017-02-23 MED ORDER — FENTANYL CITRATE (PF) 250 MCG/5ML IJ SOLN
INTRAMUSCULAR | Status: AC
  Filled 2017-02-23: qty 25

## 2017-02-23 MED ORDER — ACETAMINOPHEN 650 MG RE SUPP
650.0000 mg | Freq: Once | RECTAL | Status: AC
  Administered 2017-02-23: 650 mg via RECTAL

## 2017-02-23 MED ORDER — DEXTROSE 5 % IV SOLN
1.5000 g | Freq: Two times a day (BID) | INTRAVENOUS | Status: AC
  Administered 2017-02-23 – 2017-02-25 (×4): 1.5 g via INTRAVENOUS
  Filled 2017-02-23 (×4): qty 1.5

## 2017-02-23 MED ORDER — BISACODYL 10 MG RE SUPP: 10.0000 mg | Freq: Every day | RECTAL | Status: DC

## 2017-02-23 MED ORDER — ACETAMINOPHEN 500 MG PO TABS
1000.0000 mg | ORAL_TABLET | Freq: Four times a day (QID) | ORAL | Status: DC
  Administered 2017-02-23 – 2017-02-25 (×5): 1000 mg via ORAL
  Filled 2017-02-23 (×5): qty 2

## 2017-02-23 MED ORDER — SODIUM CHLORIDE 0.9 % IV SOLN
INTRAVENOUS | Status: DC
  Administered 2017-02-24: 08:00:00 via INTRAVENOUS
  Filled 2017-02-23: qty 1

## 2017-02-23 MED ORDER — THROMBIN 20000 UNITS EX SOLR
CUTANEOUS | Status: DC | PRN
  Administered 2017-02-23 (×3): via TOPICAL

## 2017-02-23 MED ORDER — 0.9 % SODIUM CHLORIDE (POUR BTL) OPTIME
TOPICAL | Status: DC | PRN
  Administered 2017-02-23: 6000 mL

## 2017-02-23 MED ORDER — ASPIRIN EC 325 MG PO TBEC
325.0000 mg | DELAYED_RELEASE_TABLET | Freq: Every day | ORAL | Status: DC
  Administered 2017-02-24: 325 mg via ORAL
  Filled 2017-02-23: qty 1

## 2017-02-23 MED ORDER — PROTAMINE SULFATE 10 MG/ML IV SOLN
INTRAVENOUS | Status: DC | PRN
  Administered 2017-02-23: 220 mg via INTRAVENOUS

## 2017-02-23 MED ORDER — ROCURONIUM BROMIDE 10 MG/ML (PF) SYRINGE
PREFILLED_SYRINGE | INTRAVENOUS | Status: DC | PRN
Start: 2017-02-23 — End: 2017-02-23
  Administered 2017-02-23 (×2): 50 mg via INTRAVENOUS

## 2017-02-23 MED ORDER — ACETAMINOPHEN 160 MG/5ML PO SOLN: 1000.0000 mg | Freq: Four times a day (QID) | ORAL | Status: DC

## 2017-02-23 MED ORDER — MAGNESIUM SULFATE 4 GM/100ML IV SOLN
4.0000 g | Freq: Once | INTRAVENOUS | Status: AC
  Administered 2017-02-23: 4 g via INTRAVENOUS
  Filled 2017-02-23: qty 100

## 2017-02-23 MED ORDER — MIDAZOLAM HCL 10 MG/2ML IJ SOLN
INTRAMUSCULAR | Status: AC
  Filled 2017-02-23: qty 2

## 2017-02-23 MED ORDER — PAPAVERINE HCL 30 MG/ML IJ SOLN
INTRAMUSCULAR | Status: DC | PRN
  Administered 2017-02-23: 500 mL via INTRAVASCULAR

## 2017-02-23 MED ORDER — HEMOSTATIC AGENTS (NO CHARGE) OPTIME
TOPICAL | Status: DC | PRN
  Administered 2017-02-23: 1 via TOPICAL

## 2017-02-23 MED ORDER — HEPARIN SODIUM (PORCINE) 1000 UNIT/ML IJ SOLN
INTRAMUSCULAR | Status: AC
  Filled 2017-02-23: qty 1

## 2017-02-23 MED ORDER — METOPROLOL TARTRATE 5 MG/5ML IV SOLN: 2.5000 mg | INTRAVENOUS | Status: DC | PRN

## 2017-02-23 MED ORDER — SODIUM CHLORIDE 0.9 % IV SOLN: INTRAVENOUS | Status: DC

## 2017-02-23 SURGICAL SUPPLY — 108 items
BAG DECANTER FOR FLEXI CONT (MISCELLANEOUS) ×4 IMPLANT
BANDAGE ACE 4X5 VEL STRL LF (GAUZE/BANDAGES/DRESSINGS) ×4 IMPLANT
BANDAGE ACE 6X5 VEL STRL LF (GAUZE/BANDAGES/DRESSINGS) ×4 IMPLANT
BANDAGE ELASTIC 4 VELCRO ST LF (GAUZE/BANDAGES/DRESSINGS) ×4 IMPLANT
BANDAGE ELASTIC 6 VELCRO ST LF (GAUZE/BANDAGES/DRESSINGS) ×4 IMPLANT
BASKET HEART  (ORDER IN 25'S) (MISCELLANEOUS) ×1
BASKET HEART (ORDER IN 25'S) (MISCELLANEOUS) ×1
BASKET HEART (ORDER IN 25S) (MISCELLANEOUS) ×2 IMPLANT
BLADE STERNUM SYSTEM 6 (BLADE) ×4 IMPLANT
BLADE SURG 11 STRL SS (BLADE) ×4 IMPLANT
BNDG GAUZE ELAST 4 BULKY (GAUZE/BANDAGES/DRESSINGS) ×4 IMPLANT
CANISTER SUCT 3000ML PPV (MISCELLANEOUS) ×4 IMPLANT
CATH ROBINSON RED A/P 18FR (CATHETERS) ×8 IMPLANT
CATH THORACIC 28FR (CATHETERS) ×8 IMPLANT
CATH THORACIC 36FR (CATHETERS) ×4 IMPLANT
CATH THORACIC 36FR RT ANG (CATHETERS) ×4 IMPLANT
CLIP FOGARTY SPRING 6M (CLIP) ×4 IMPLANT
CLIP TI MEDIUM 24 (CLIP) IMPLANT
CLIP TI WIDE RED SMALL 24 (CLIP) ×12 IMPLANT
CRADLE DONUT ADULT HEAD (MISCELLANEOUS) ×4 IMPLANT
DRAPE CARDIOVASCULAR INCISE (DRAPES) ×2
DRAPE SLUSH/WARMER DISC (DRAPES) ×4 IMPLANT
DRAPE SRG 135X102X78XABS (DRAPES) ×2 IMPLANT
DRSG COVADERM 4X14 (GAUZE/BANDAGES/DRESSINGS) ×4 IMPLANT
ELECT CAUTERY BLADE 6.4 (BLADE) ×8 IMPLANT
ELECT REM PT RETURN 9FT ADLT (ELECTROSURGICAL) ×8
ELECTRODE REM PT RTRN 9FT ADLT (ELECTROSURGICAL) ×4 IMPLANT
FELT TEFLON 1X6 (MISCELLANEOUS) ×8 IMPLANT
GAUZE SPONGE 4X4 12PLY STRL (GAUZE/BANDAGES/DRESSINGS) ×8 IMPLANT
GAUZE SPONGE 4X4 12PLY STRL LF (GAUZE/BANDAGES/DRESSINGS) ×8 IMPLANT
GLOVE BIO SURGEON STRL SZ 6 (GLOVE) IMPLANT
GLOVE BIO SURGEON STRL SZ 6.5 (GLOVE) ×9 IMPLANT
GLOVE BIO SURGEON STRL SZ7 (GLOVE) ×4 IMPLANT
GLOVE BIO SURGEON STRL SZ7.5 (GLOVE) IMPLANT
GLOVE BIO SURGEONS STRL SZ 6.5 (GLOVE) ×3
GLOVE BIOGEL PI IND STRL 6 (GLOVE) IMPLANT
GLOVE BIOGEL PI IND STRL 6.5 (GLOVE) ×12 IMPLANT
GLOVE BIOGEL PI IND STRL 7.0 (GLOVE) IMPLANT
GLOVE BIOGEL PI INDICATOR 6 (GLOVE)
GLOVE BIOGEL PI INDICATOR 6.5 (GLOVE) ×12
GLOVE BIOGEL PI INDICATOR 7.0 (GLOVE)
GLOVE EUDERMIC 7 POWDERFREE (GLOVE) ×8 IMPLANT
GLOVE ORTHO TXT STRL SZ7.5 (GLOVE) IMPLANT
GLOVE SURG SS PI 6.0 STRL IVOR (GLOVE) ×8 IMPLANT
GOWN STRL REUS W/ TWL LRG LVL3 (GOWN DISPOSABLE) ×8 IMPLANT
GOWN STRL REUS W/ TWL XL LVL3 (GOWN DISPOSABLE) ×2 IMPLANT
GOWN STRL REUS W/TWL LRG LVL3 (GOWN DISPOSABLE) ×8
GOWN STRL REUS W/TWL XL LVL3 (GOWN DISPOSABLE) ×2
HEMOSTAT POWDER SURGIFOAM 1G (HEMOSTASIS) ×12 IMPLANT
HEMOSTAT SURGICEL 2X14 (HEMOSTASIS) ×4 IMPLANT
INSERT FOGARTY 61MM (MISCELLANEOUS) IMPLANT
INSERT FOGARTY XLG (MISCELLANEOUS) IMPLANT
KIT BASIN OR (CUSTOM PROCEDURE TRAY) ×4 IMPLANT
KIT CATH CPB BARTLE (MISCELLANEOUS) ×4 IMPLANT
KIT ROOM TURNOVER OR (KITS) ×4 IMPLANT
KIT SUCTION CATH 14FR (SUCTIONS) ×4 IMPLANT
KIT VASOVIEW HEMOPRO VH 3000 (KITS) ×4 IMPLANT
NS IRRIG 1000ML POUR BTL (IV SOLUTION) ×20 IMPLANT
PACK OPEN HEART (CUSTOM PROCEDURE TRAY) ×4 IMPLANT
PAD ARMBOARD 7.5X6 YLW CONV (MISCELLANEOUS) ×8 IMPLANT
PAD ELECT DEFIB RADIOL ZOLL (MISCELLANEOUS) ×4 IMPLANT
PENCIL BUTTON HOLSTER BLD 10FT (ELECTRODE) ×8 IMPLANT
PUNCH AORTIC ROTATE 4.0MM (MISCELLANEOUS) IMPLANT
PUNCH AORTIC ROTATE 4.5MM 8IN (MISCELLANEOUS) ×4 IMPLANT
PUNCH AORTIC ROTATE 5MM 8IN (MISCELLANEOUS) IMPLANT
SET CARDIOPLEGIA MPS 5001102 (MISCELLANEOUS) ×4 IMPLANT
SOLUTION ANTI FOG 6CC (MISCELLANEOUS) ×4 IMPLANT
SPONGE INTESTINAL PEANUT (DISPOSABLE) IMPLANT
SPONGE LAP 18X18 X RAY DECT (DISPOSABLE) ×4 IMPLANT
SPONGE LAP 4X18 X RAY DECT (DISPOSABLE) ×4 IMPLANT
SUT BONE WAX W31G (SUTURE) ×4 IMPLANT
SUT MNCRL AB 4-0 PS2 18 (SUTURE) ×4 IMPLANT
SUT PROLENE 3 0 SH DA (SUTURE) IMPLANT
SUT PROLENE 3 0 SH1 36 (SUTURE) ×4 IMPLANT
SUT PROLENE 4 0 RB 1 (SUTURE)
SUT PROLENE 4 0 SH DA (SUTURE) IMPLANT
SUT PROLENE 4-0 RB1 .5 CRCL 36 (SUTURE) IMPLANT
SUT PROLENE 5 0 C 1 36 (SUTURE) IMPLANT
SUT PROLENE 6 0 C 1 30 (SUTURE) ×8 IMPLANT
SUT PROLENE 7 0 BV 1 (SUTURE) IMPLANT
SUT PROLENE 7 0 BV1 MDA (SUTURE) ×4 IMPLANT
SUT PROLENE 8 0 BV175 6 (SUTURE) ×4 IMPLANT
SUT SILK  1 MH (SUTURE) ×2
SUT SILK 1 MH (SUTURE) ×2 IMPLANT
SUT SILK 2 0 SH (SUTURE) ×8 IMPLANT
SUT STEEL STERNAL CCS#1 18IN (SUTURE) IMPLANT
SUT STEEL SZ 6 DBL 3X14 BALL (SUTURE) ×12 IMPLANT
SUT VIC AB 1 CTX 36 (SUTURE) ×4
SUT VIC AB 1 CTX36XBRD ANBCTR (SUTURE) ×4 IMPLANT
SUT VIC AB 2-0 CT1 27 (SUTURE) ×2
SUT VIC AB 2-0 CT1 TAPERPNT 27 (SUTURE) ×2 IMPLANT
SUT VIC AB 2-0 CTX 27 (SUTURE) IMPLANT
SUT VIC AB 3-0 SH 27 (SUTURE)
SUT VIC AB 3-0 SH 27X BRD (SUTURE) IMPLANT
SUT VIC AB 3-0 X1 27 (SUTURE) IMPLANT
SUT VICRYL 4-0 PS2 18IN ABS (SUTURE) IMPLANT
SUTURE E-PAK OPEN HEART (SUTURE) ×4 IMPLANT
SYSTEM SAHARA CHEST DRAIN ATS (WOUND CARE) ×4 IMPLANT
TAPE CLOTH SURG 4X10 WHT LF (GAUZE/BANDAGES/DRESSINGS) ×8 IMPLANT
TAPE PAPER 3X10 WHT MICROPORE (GAUZE/BANDAGES/DRESSINGS) ×4 IMPLANT
TOWEL GREEN STERILE (TOWEL DISPOSABLE) ×16 IMPLANT
TOWEL GREEN STERILE FF (TOWEL DISPOSABLE) ×8 IMPLANT
TOWEL OR 17X24 6PK STRL BLUE (TOWEL DISPOSABLE) ×4 IMPLANT
TOWEL OR 17X26 10 PK STRL BLUE (TOWEL DISPOSABLE) ×4 IMPLANT
TRAY FOLEY SILVER 16FR TEMP (SET/KITS/TRAYS/PACK) ×4 IMPLANT
TUBING INSUFFLATION (TUBING) ×4 IMPLANT
UNDERPAD 30X30 (UNDERPADS AND DIAPERS) ×4 IMPLANT
WATER STERILE IRR 1000ML POUR (IV SOLUTION) ×8 IMPLANT

## 2017-02-23 NOTE — Progress Notes (Signed)
ANTICOAGULATION CONSULT NOTE - Follow Up Consult  Pharmacy Consult for Heparin  Indication: CABG 6/8  No Known Allergies  Patient Measurements: Height: 5\' 9"  (175.3 cm) Weight: 185 lb 6.4 oz (84.1 kg) IBW/kg (Calculated) : 70.7  Vital Signs: Temp: 98.6 F (37 C) (06/07 2013) Temp Source: Oral (06/07 2013) BP: 130/77 (06/07 2013) Pulse Rate: 70 (06/07 2013)  Labs:  Recent Labs  02/20/17 1944 02/21/17 0143 02/21/17 1458 02/21/17 2138 02/22/17 0509 02/23/17 0247  HGB  --  14.5  --   --  15.0 13.7  HCT  --  40.9  --   --  42.5 39.7  PLT  --  144* 147*  --  151 134*  APTT  --   --  30  --   --   --   LABPROT  --   --  13.9  --  14.6  --   INR  --   --  1.07  --  1.13  --   HEPARINUNFRC  --   --   --  0.63 0.74* 0.20*  CREATININE  --  0.85  --   --  0.98 0.93  TROPONINI 0.04* 0.05* 0.03*  --   --   --     Estimated Creatinine Clearance: 79.2 mL/min (by C-G formula based on SCr of 0.93 mg/dL).    Assessment: CABG this AM, sub-therapeutic heparin level, no issues per RN  Goal of Therapy:  Heparin level 0.3-0.7 units/ml Monitor platelets by anticoagulation protocol: Yes   Plan:  -Inc heparin to 950 units/hr -F/U post-op  Abran DukeLedford, Melvena Vink 02/23/2017,4:28 AM

## 2017-02-23 NOTE — Transfer of Care (Signed)
Immediate Anesthesia Transfer of Care Note  Patient: Jordan Wong  Procedure(s) Performed: Procedure(s): CORONARY ARTERY BYPASS GRAFT times five using Bilateral Mammary arteries and Right leg saphenous vein (N/A) TRANSESOPHAGEAL ECHOCARDIOGRAM (TEE) (N/A)  Patient Location: ICU  Anesthesia Type:General  Level of Consciousness: sedated and Patient remains intubated per anesthesia plan  Airway & Oxygen Therapy: Patient remains intubated per anesthesia plan and Patient placed on Ventilator (see vital sign flow sheet for setting)  Post-op Assessment: Report given to RN and Post -op Vital signs reviewed and stable  Post vital signs: Reviewed and stable  Last Vitals:  Vitals:   02/23/17 0706 02/23/17 0707  BP:    Pulse: (!) 55 (!) 55  Resp: 10 (!) 9  Temp:      Last Pain:  Vitals:   02/23/17 0507  TempSrc: Oral         Complications: No apparent anesthesia complications

## 2017-02-23 NOTE — Op Note (Signed)
CARDIOVASCULAR SURGERY OPERATIVE NOTE  02/23/2017  Surgeon:  Alleen BorneBryan K. Bartle, MD  First Assistant: Jari Favreessa Conte, PA-C   Preoperative Diagnosis:  Severe multi-vessel coronary artery disease   Postoperative Diagnosis:  Same   Procedure:  1. Median Sternotomy 2. Extracorporeal circulation 3.   Coronary artery bypass grafting x 5   Left internal mammary artery graft to the LAD  Free right internal mammary artery graft to the RCA  SVG to diagonal  SVG to Ramus  SVG to OM 4.   Endoscopic vein harvest from the right leg   Anesthesia:  General Endotracheal   Clinical History/Surgical Indication:  This is a 66 year old male patient with a past medical history of [facility] and hyperlipidemia who [facility]2017 with exertional chest pain over the past several weeks. Recently, his chest pain became more frequent and more intense therefore, he came to the ED. Denies smoking history and denies having a significant family history of premature coronary artery disease.Troponin was not significantly elevated. He was admitted and underwent a Echocardiogram which showed an EF of 55-60% and no significant valvular disease. He also underwent a cardiac catheterization which revealed multivessel disease. It was felt that CABG was the best treatment. I discussed the operative procedure with the patient and family including alternatives, benefits and risks; including but not limited to bleeding, blood transfusion, infection, stroke, myocardial infarction, graft failure, heart block requiring a permanent pacemaker, organ dysfunction, and death.  Rosanne SackPaul Saefong understands and agrees to proceed.   Preparation:  The patient was seen in the preoperative holding area and the correct patient, correct operation were confirmed with the patient after reviewing the medical record and  catheterization. The consent was signed by me. Preoperative antibiotics were given. A pulmonary arterial line and radial arterial line were placed by the anesthesia team. The patient was taken back to the operating room and positioned supine on the operating room table. After being placed under general endotracheal anesthesia by the anesthesia team a foley catheter was placed. The neck, chest, abdomen, and both legs were prepped with betadine soap and solution and draped in the usual sterile manner. A surgical time-out was taken and the correct patient and operative procedure were confirmed with the nursing and anesthesia staff.   Cardiopulmonary Bypass:  A median sternotomy was performed. The pericardium was opened in the midline. Right ventricular function appeared normal. The ascending aorta was of normal size and had no palpable plaque. There were no contraindications to aortic cannulation or cross-clamping. The patient was fully systemically heparinized and the ACT was maintained > 400 sec. The proximal aortic arch was cannulated with a 20 F aortic cannula for arterial inflow. Venous cannulation was performed via the right atrial appendage using a two-staged venous cannula. An antegrade cardioplegia/vent cannula was inserted into the mid-ascending aorta. Aortic occlusion was performed with a single cross-clamp. Systemic cooling to 32 degrees Centigrade and topical cooling of the heart with iced saline were used. Hyperkalemic antegrade cold blood cardioplegia was used to induce diastolic arrest and was then given at about 20 minute intervals throughout the period of arrest to maintain myocardial temperature at or below 10 degrees centigrade. A temperature probe was inserted into the interventricular septum and an insulating pad was placed in the pericardium.   Left internal mammary harvest:  The left side of the sternum was retracted using the Rultract retractor. The left internal mammary artery was  harvested as a pedicle  graft. All side branches were clipped. It was a medium-sized vessel of good quality with excellent blood flow. It was ligated distally and divided. It was sprayed with topical papaverine solution to prevent vasospasm.  Right internal mammary harvest:  The right side of the sternum was retracted using the Rultract retractor. The right internal mammary artery was harvested as a pedicle graft. All side branches were clipped. It was a medium-sized vessel of good quality with excellent blood flow. It was ligated distally and divided. It was sprayed with topical papaverine solution to prevent vasospasm.  Endoscopic vein harvest:  The right greater saphenous vein was harvested endoscopically through a 2 cm incision medial to the right knee. It was harvested from the upper thigh to below the knee. It was a medium-sized vein of good quality. The side branches were all ligated with 4-0 silk ties.    Coronary arteries:  The coronary arteries were examined.   LAD:  Intramyocardial throughout its proximal half. It exited to the surface in the area of the 50% distal stenosis noted on cath. Beyond this it was a medium caliber vessel with no disease. The diagonal was a medium caliber vessel with no disease.  LCX:  Large Ramus that was completely intramyocardial but located in the proximal to mid portion beyond the stenosis and was a large vessel with no disease.  RCA:  The proximal vessel was lying deep in the AV groove. It exited just beyond the acute margin where there was no disease and it was a medium caliber vessel.    Grafts:  1. LIMA to the distal LAD: 1.75 mm. It was sewn end to side using 8-0 prolene continuous suture. 2. Free RIMA to the mid RCA:  1.75 mm. It was sewn end to side using 8-0 prolene continuous suture. The RIMA was not long enough to reach the RCA as a pedicle graft and was therefore used as a free graft. The stump of the IMA was suture ligated with 2-0  silk. 3. SVG to diagonal:  1.6 mm. It was sewn end to side using 7-0 prolene continuous suture. 4.   SVG to Ramus:  2.5 mm. It was sewn end to side using 7-0 prolene continuous suture. 5.   SVG to OM:  1.75 mm.  It was sewn end to side using 7-0 prolene continuous suture.  The proximal vein graft anastomoses were performed to the mid-ascending aorta using continuous 6-0 prolene suture. The proximal anastomosis of the free RIMA graft was performed to the hood of the OM vein graft in end to side manner using continuous 7-0 prolene suture. Graft markers were placed around the proximal anastomoses.   Completion:  The patient was rewarmed to 37 degrees Centigrade. The clamp was removed from the LIMA pedicle and there was rapid warming of the septum and return of ventricular fibrillation. The crossclamp was removed with a time of 134 minutes. There was spontaneous return of sinus rhythm. The distal and proximal anastomoses were checked for hemostasis. The position of the grafts was satisfactory. Two temporary epicardial pacing wires were placed on the right atrium and two on the right ventricle. The patient was weaned from CPB without difficulty on no inotropes. CPB time was 157 minutes. Cardiac output was 5 LPM. TEE showed good LV and RV function. Heparin was fully reversed with protamine and the aortic and venous cannulas removed. Hemostasis was achieved. Mediastinal and left pleural drainage tubes were placed. The sternum was closed with double #6 stainless steel  wires. The fascia was closed with continuous # 1 vicryl suture. The subcutaneous tissue was closed with 2-0 vicryl continuous suture. The skin was closed with 3-0 vicryl subcuticular suture. All sponge, needle, and instrument counts were reported correct at the end of the case. Dry sterile dressings were placed over the incisions and around the chest tubes which were connected to pleurevac suction. The patient was then transported to the surgical  intensive care unit in critical but stable condition.

## 2017-02-23 NOTE — Progress Notes (Signed)
Patient ID: Jordan Wong, male   DOB: 04/25/1951, 66 y.o.   MRN: [id number]   SICU Evening Rounds:   Hemodynamically stable  CI = 2.6  Just extubated. Awake and neuro intact  Urine output good  CT output low  CBC    Component Value Date/Time   WBC 14.8 (H) 02/23/2017 1426   RBC 3.98 (L) 02/23/2017 1426   HGB 10.9 (L) 02/23/2017 1430   HGB 13.1 02/22/2016 1545   HCT 32.0 (L) 02/23/2017 1430   HCT 38.4 02/22/2016 1545   PLT 126 (L) 02/23/2017 1426   PLT 143 (L) 02/22/2016 1545   MCV 87.7 02/23/2017 1426   MCV 89 02/22/2016 1545   MCH 30.4 02/23/2017 1426   MCHC 34.7 02/23/2017 1426   RDW 13.8 02/23/2017 1426   RDW 13.5 02/22/2016 1545   LYMPHSABS 0.9 02/22/2016 1545   EOSABS 0.1 02/22/2016 1545   BASOSABS 0.0 02/22/2016 1545     BMET    Component Value Date/Time   NA 142 02/23/2017 1430   K 4.0 02/23/2017 1430   CL 102 02/23/2017 1153   CO2 27 02/23/2017 0247   GLUCOSE 118 (H) 02/23/2017 1430   BUN 8 02/23/2017 1153   CREATININE 0.60 (L) 02/23/2017 1153   CALCIUM 8.8 (L) 02/23/2017 0247   GFRNONAA >60 02/23/2017 0247   GFRAA >60 02/23/2017 0247     A/P:  Stable postop course. Continue current plans

## 2017-02-23 NOTE — Brief Op Note (Signed)
02/22/2017 - 02/23/2017  11:59 AM  PATIENT:  Jordan Wong  66 y.o. male  PRE-OPERATIVE DIAGNOSIS:  CAD  POST-OPERATIVE DIAGNOSIS:  coronary artery disease  PROCEDURE:  Procedure(s): CORONARY ARTERY BYPASS GRAFT times five using Bilateral Mammary arteries and Right leg saphenous vein (N/A) TRANSESOPHAGEAL ECHOCARDIOGRAM (TEE) (N/A)  LIMA to LAD RIMA to RCA SVG to Diag 1 SVG to OM1 SVG to Ramus intermedius  SURGEON:  Surgeon(s) and Role:    * Bartle, Payton DoughtyBryan K, MD - Primary  PHYSICIAN ASSISTANT:  Jari Favreessa Harleen Fineberg, PA-C   ANESTHESIA:   general  EBL:  Total I/O In: 800 [I.V.:800] Out: 425 [Urine:425]  BLOOD ADMINISTERED:none  DRAINS: ROUTINE   LOCAL MEDICATIONS USED:  NONE  SPECIMEN:  No Specimen  DISPOSITION OF SPECIMEN:  N/A  COUNTS:  YES  TOURNIQUET:  * No tourniquets in log *  DICTATION: .Dragon Dictation  PLAN OF CARE: Admit to inpatient   PATIENT DISPOSITION:  ICU - intubated and hemodynamically stable.   Delay start of Pharmacological VTE agent (>24hrs) due to surgical blood loss or risk of bleeding: yes

## 2017-02-23 NOTE — Progress Notes (Addendum)
Initiated Open Heart Rapid Wean per protocol. RT will continue to monitor.  

## 2017-02-23 NOTE — Anesthesia Procedure Notes (Signed)
Arterial Line Insertion Start/End6/04/2017 7:00 AM, 02/23/2017 7:05 AM Performed by: Sharee HolsterMASSAGEE, TERRY, anesthesiologist  Patient location: Pre-op. Preanesthetic checklist: patient identified, IV checked, site marked, risks and benefits discussed, surgical consent, monitors and equipment checked, pre-op evaluation and timeout performed Lidocaine 1% used for infiltration and patient sedated Left, radial was placed Catheter size: 20 G Hand hygiene performed , maximum sterile barriers used  and Seldinger technique used Allen's test indicative of satisfactory collateral circulation Attempts: 1 Procedure performed without using ultrasound guided technique. Ultrasound Notes:anatomy identified, needle tip was noted to be adjacent to the nerve/plexus identified and no ultrasound evidence of intravascular and/or intraneural injection Following insertion, dressing applied and Biopatch. Post procedure assessment: normal  Patient tolerated the procedure well with no immediate complications.

## 2017-02-23 NOTE — Procedures (Signed)
Extubation Procedure Note  Patient Details:   Name: Jordan Wong DOB: September 13, 1951 MRN: [id number]   Airway Documentation:     Evaluation  O2 sats: stable throughout Complications: No apparent complications Patient did tolerate procedure well. Bilateral Breath Sounds: Clear, Diminished   Yes   Pt extubated to 3L [place] per Open Heart Rapid Wean Protocol. Pt following all commands, ABG within normal limits, VC 0.73, NIF -22. Per Protocol, all requirements met for extubation. Pt stable throughout with no complications. Pt has a weak cough and is able to speak post extubation. Incentive Spirometer initiated immediately. RT will continue to monitor.   Jesse Sans 02/23/2017, 6:20 PM

## 2017-02-23 NOTE — Anesthesia Preprocedure Evaluation (Addendum)
Anesthesia Evaluation  Patient identified by MRN, date of birth, ID band Patient awake    Reviewed: Allergy & Precautions, NPO status , Patient's Chart, lab work & pertinent test results  Airway Mallampati: II   Neck ROM: Full    Dental no notable dental hx.    Pulmonary shortness of breath,    breath sounds clear to auscultation       Cardiovascular + angina + CAD   Rhythm:Regular Rate:Normal     Neuro/Psych negative neurological ROS     GI/Hepatic negative GI ROS, Neg liver ROS, (+) Hepatitis -  Endo/Other    Renal/GU      Musculoskeletal   Abdominal   Peds  Hematology   Anesthesia Other Findings   Reproductive/Obstetrics                            Anesthesia Physical Anesthesia Plan  ASA: III  Anesthesia Plan: General   Post-op Pain Management:    Induction: Intravenous  PONV Risk Score and Plan: 3 and Ondansetron, Dexamethasone, Propofol, Midazolam and Treatment may vary due to age  Airway Management Planned: Oral ETT  Additional Equipment: PA Cath, Ultrasound Guidance Line Placement and 3D TEE  Intra-op Plan:   Post-operative Plan: Post-operative intubation/ventilation  Informed Consent: I have reviewed the patients History and Physical, chart, labs and discussed the procedure including the risks, benefits and alternatives for the proposed anesthesia with the patient or authorized representative who has indicated his/her understanding and acceptance.     Plan Discussed with: CRNA  Anesthesia Plan Comments:         Anesthesia Quick Evaluation

## 2017-02-23 NOTE — Anesthesia Postprocedure Evaluation (Signed)
Anesthesia Post Note  Patient: Rosanne Sackaul Salva  Procedure(s) Performed: Procedure(s) (LRB): CORONARY ARTERY BYPASS GRAFT times five using Bilateral Mammary arteries and Right leg saphenous vein (N/A) TRANSESOPHAGEAL ECHOCARDIOGRAM (TEE) (N/A)     Patient location during evaluation: SICU Anesthesia Type: General Level of consciousness: sedated and patient remains intubated per anesthesia plan Pain management: pain level controlled Vital Signs Assessment: post-procedure vital signs reviewed and stable Respiratory status: patient remains intubated per anesthesia plan Cardiovascular status: stable Anesthetic complications: no    Last Vitals:  Vitals:   02/23/17 1445 02/23/17 1500  BP:  101/72  Pulse: 89 89  Resp: 13 13  Temp: (!) 35.7 C (!) 35.7 C    Last Pain:  Vitals:   02/23/17 1420  TempSrc: Core (Comment)                 Ashani Pumphrey,JAMES TERRILL

## 2017-02-23 NOTE — Anesthesia Procedure Notes (Addendum)
Procedure Name: Intubation Performed by: Candis Shine Pre-anesthesia Checklist: Patient identified, Emergency Drugs available, Suction available, Patient being monitored and Timeout performed Patient Re-evaluated:Patient Re-evaluated prior to inductionOxygen Delivery Method: Circle system utilized Preoxygenation: Pre-oxygenation with 100% oxygen Intubation Type: IV induction and Cricoid Pressure applied Ventilation: Mask ventilation without difficulty Laryngoscope Size: Mac and 4 Tube type: Oral Tube size: 8.0 mm Number of attempts: 2 Airway Equipment and Method: Stylet Secured at: 24 cm Dental Injury: Teeth and Oropharynx as per pre-operative assessment  Comments: By Briscoe Burns

## 2017-02-23 NOTE — Anesthesia Procedure Notes (Signed)
Central Venous Catheter Insertion Performed by: Sharee HolsterMASSAGEE, Arjan Strohm, anesthesiologist Start/End6/04/2017 6:45 AM, 02/23/2017 7:00 AM Patient location: OR. Preanesthetic checklist: patient identified, IV checked, site marked, risks and benefits discussed, surgical consent, monitors and equipment checked, pre-op evaluation and timeout performed Position: Trendelenburg Lidocaine 1% used for infiltration and patient sedated Hand hygiene performed , maximum sterile barriers used  and Seldinger technique used Catheter size: 8.5 Fr PA cath was placed.Swan type:thermodilution Procedure performed using ultrasound guided technique. Ultrasound Notes:anatomy identified, needle tip was noted to be adjacent to the nerve/plexus identified and no ultrasound evidence of intravascular and/or intraneural injection Attempts: 1 Following insertion, line sutured and dressing applied. Post procedure assessment: blood return through all ports, no air and free fluid flow  Patient tolerated the procedure well with no immediate complications.

## 2017-02-24 ENCOUNTER — Inpatient Hospital Stay (HOSPITAL_COMMUNITY): Payer: Medicare HMO

## 2017-02-24 LAB — CBC
HEMATOCRIT: 31.5 % — AB (ref 39.0–52.0)
HEMATOCRIT: 31.4 % — AB (ref 39.0–52.0)
Hemoglobin: 11 g/dL — ABNORMAL LOW (ref 13.0–17.0)
Hemoglobin: 10.8 g/dL — ABNORMAL LOW (ref 13.0–17.0)
MCH: 30.4 pg (ref 26.0–34.0)
MCH: 30.4 pg (ref 26.0–34.0)
MCHC: 34.9 g/dL (ref 30.0–36.0)
MCHC: 34.4 g/dL (ref 30.0–36.0)
MCV: 87 fL (ref 78.0–100.0)
MCV: 88.5 fL (ref 78.0–100.0)
PLATELETS: 113 10*3/uL — AB (ref 150–400)
Platelets: 115 10*3/uL — ABNORMAL LOW (ref 150–400)
RBC: 3.62 MIL/uL — AB (ref 4.22–5.81)
RBC: 3.55 MIL/uL — AB (ref 4.22–5.81)
RDW: 13.8 % (ref 11.5–15.5)
RDW: 14.1 % (ref 11.5–15.5)
WBC: 12.6 10*3/uL — ABNORMAL HIGH (ref 4.0–10.5)
WBC: 14.4 10*3/uL — AB (ref 4.0–10.5)

## 2017-02-24 LAB — GLUCOSE, CAPILLARY
GLUCOSE-CAPILLARY: 112 mg/dL — AB (ref 65–99)
GLUCOSE-CAPILLARY: 124 mg/dL — AB (ref 65–99)
GLUCOSE-CAPILLARY: 126 mg/dL — AB (ref 65–99)
GLUCOSE-CAPILLARY: 129 mg/dL — AB (ref 65–99)
GLUCOSE-CAPILLARY: 138 mg/dL — AB (ref 65–99)
Glucose-Capillary: 110 mg/dL — ABNORMAL HIGH (ref 65–99)
Glucose-Capillary: 119 mg/dL — ABNORMAL HIGH (ref 65–99)
Glucose-Capillary: 124 mg/dL — ABNORMAL HIGH (ref 65–99)
Glucose-Capillary: 126 mg/dL — ABNORMAL HIGH (ref 65–99)
Glucose-Capillary: 129 mg/dL — ABNORMAL HIGH (ref 65–99)
Glucose-Capillary: 130 mg/dL — ABNORMAL HIGH (ref 65–99)
Glucose-Capillary: 130 mg/dL — ABNORMAL HIGH (ref 65–99)
Glucose-Capillary: 136 mg/dL — ABNORMAL HIGH (ref 65–99)
Glucose-Capillary: 145 mg/dL — ABNORMAL HIGH (ref 65–99)
Glucose-Capillary: 92 mg/dL (ref 65–99)

## 2017-02-24 LAB — BASIC METABOLIC PANEL
ANION GAP: 7 (ref 5–15)
BUN: 8 mg/dL (ref 6–20)
CO2: 23 mmol/L (ref 22–32)
Calcium: 7.9 mg/dL — ABNORMAL LOW (ref 8.9–10.3)
Chloride: 106 mmol/L (ref 101–111)
Creatinine, Ser: 0.82 mg/dL (ref 0.61–1.24)
GLUCOSE: 118 mg/dL — AB (ref 65–99)
POTASSIUM: 3.7 mmol/L (ref 3.5–5.1)
Sodium: 136 mmol/L (ref 135–145)

## 2017-02-24 LAB — POCT I-STAT, CHEM 8
BUN: 10 mg/dL (ref 6–20)
CALCIUM ION: 1.19 mmol/L (ref 1.15–1.40)
CHLORIDE: 97 mmol/L — AB (ref 101–111)
CREATININE: 0.9 mg/dL (ref 0.61–1.24)
GLUCOSE: 147 mg/dL — AB (ref 65–99)
HCT: 29 % — ABNORMAL LOW (ref 39.0–52.0)
Hemoglobin: 9.9 g/dL — ABNORMAL LOW (ref 13.0–17.0)
Potassium: 4.5 mmol/L (ref 3.5–5.1)
Sodium: 135 mmol/L (ref 135–145)
TCO2: 27 mmol/L (ref 0–100)

## 2017-02-24 LAB — MAGNESIUM
MAGNESIUM: 2.1 mg/dL (ref 1.7–2.4)
Magnesium: 2.3 mg/dL (ref 1.7–2.4)

## 2017-02-24 LAB — CREATININE, SERUM: Creatinine, Ser: 0.92 mg/dL (ref 0.61–1.24)

## 2017-02-24 MED ORDER — FUROSEMIDE 10 MG/ML IJ SOLN
40.0000 mg | Freq: Once | INTRAMUSCULAR | Status: AC
  Administered 2017-02-24: 40 mg via INTRAVENOUS
  Filled 2017-02-24: qty 4

## 2017-02-24 MED ORDER — POTASSIUM CHLORIDE 10 MEQ/50ML IV SOLN
10.0000 meq | INTRAVENOUS | Status: AC
  Administered 2017-02-24 (×3): 10 meq via INTRAVENOUS
  Filled 2017-02-24: qty 50

## 2017-02-24 MED ORDER — INSULIN DETEMIR 100 UNIT/ML ~~LOC~~ SOLN
10.0000 [IU] | Freq: Every day | SUBCUTANEOUS | Status: AC
  Administered 2017-02-24: 10 [IU] via SUBCUTANEOUS
  Filled 2017-02-24: qty 0.1

## 2017-02-24 MED ORDER — INSULIN ASPART 100 UNIT/ML ~~LOC~~ SOLN
0.0000 [IU] | SUBCUTANEOUS | Status: DC
  Administered 2017-02-24 – 2017-02-25 (×5): 2 [IU] via SUBCUTANEOUS

## 2017-02-24 MED ORDER — METOPROLOL TARTRATE 25 MG/10 ML ORAL SUSPENSION: 25.0000 mg | Freq: Two times a day (BID) | ORAL | Status: DC

## 2017-02-24 MED ORDER — POTASSIUM CHLORIDE CRYS ER 20 MEQ PO TBCR
20.0000 meq | EXTENDED_RELEASE_TABLET | Freq: Two times a day (BID) | ORAL | Status: DC
  Administered 2017-02-24 (×2): 20 meq via ORAL
  Filled 2017-02-24 (×2): qty 1

## 2017-02-24 MED ORDER — ENOXAPARIN SODIUM 40 MG/0.4ML ~~LOC~~ SOLN
40.0000 mg | Freq: Every day | SUBCUTANEOUS | Status: DC
  Administered 2017-02-24 – 2017-02-26 (×3): 40 mg via SUBCUTANEOUS
  Filled 2017-02-24 (×3): qty 0.4

## 2017-02-24 MED ORDER — METOPROLOL TARTRATE 25 MG PO TABS
25.0000 mg | ORAL_TABLET | Freq: Two times a day (BID) | ORAL | Status: DC
  Administered 2017-02-24 (×2): 25 mg via ORAL
  Filled 2017-02-24 (×2): qty 1

## 2017-02-24 NOTE — Progress Notes (Signed)
1 Day Post-Op Procedure(s) (LRB): CORONARY ARTERY BYPASS GRAFT times five using Bilateral Mammary arteries and Right leg saphenous vein (N/A) TRANSESOPHAGEAL ECHOCARDIOGRAM (TEE) (N/A) Subjective: No complaints    Objective: Vital signs in last 24 hours: Temp:  [96.3 F (35.7 C)-99.9 F (37.7 C)] 99.5 F (37.5 C) (06/09 0730) Pulse Rate:  [80-115] 98 (06/09 0730) Cardiac Rhythm: Normal sinus rhythm (06/09 0400) Resp:  [12-33] 27 (06/09 0730) BP: (91-120)/(55-77) 105/61 (06/09 0700) SpO2:  [91 %-100 %] 93 % (06/09 0730) Arterial Line BP: (95-148)/(45-64) 141/51 (06/09 0730) FiO2 (%):  [50 %] 50 % (06/08 1530) Weight:  [82.5 kg (181 lb 14.1 oz)] 82.5 kg (181 lb 14.1 oz) (06/09 0500)  Hemodynamic parameters for last 24 hours: PAP: (19-37)/(5-18) 33/13 CO:  [3.5 L/min-7.9 L/min] 7.9 L/min CI:  [1.8 L/min/m2-4.1 L/min/m2] 4.1 L/min/m2  Intake/Output from previous day: 06/08 0701 - 06/09 0700 In: 5029.5 [P.O.:150; I.V.:3291.5; Blood:388; IV Piggyback:1200] Out: 5937 [Urine:4515; Blood:800; Chest Tube:622] Intake/Output this shift: No intake/output data recorded.  General appearance: alert and cooperative Neurologic: intact Heart: regular rate and rhythm, S1, S2 normal, no murmur, click, rub or gallop Lungs: clear to auscultation bilaterally Extremities: edema mild Wound: dressings dry  Lab Results:  Recent Labs  02/23/17 2004 02/23/17 2010 02/24/17 0355  WBC 11.4*  --  12.6*  HGB 11.5* 11.2* 11.0*  HCT 33.3* 33.0* 31.5*  PLT 106*  --  115*   BMET:  Recent Labs  02/23/17 0247  02/23/17 2010 02/24/17 0355  NA 139  < > 140 136  K 4.0  < > 4.0 3.7  CL 105  < > 103 106  CO2 27  --   --  23  GLUCOSE 110*  < > 136* 118*  BUN 8  < > 7 8  CREATININE 0.93  < > 0.70 0.82  CALCIUM 8.8*  --   --  7.9*  < > = values in this interval not displayed.  PT/INR:  Recent Labs  02/23/17 1426  LABPROT 16.3*  INR 1.30   ABG    Component Value Date/Time   PHART 7.346  (L) 02/23/2017 1859   HCO3 22.9 02/23/2017 1859   TCO2 25 02/23/2017 2010   ACIDBASEDEF 3.0 (H) 02/23/2017 1859   O2SAT 96.0 02/23/2017 1859   CBG (last 3)   Recent Labs  02/24/17 0503 02/24/17 0602 02/24/17 0654  GLUCAP 130* 129* 126*   ECG: sinus, normal  CXR: mild bibasilar atelectasis  Assessment/Plan: S/P Procedure(s) (LRB): CORONARY ARTERY BYPASS GRAFT times five using Bilateral Mammary arteries and Right leg saphenous vein (N/A) TRANSESOPHAGEAL ECHOCARDIOGRAM (TEE) (N/A)  He is hemodynamically stable in sinus rhythm. Will increase Lopressor to 25 bid Mobilize Diuresis d/c tubes/lines Continue foley due to diuresing patient and patient in ICU Still on insulin drip but no hx of DM and preop Hgb A1c 4.9. Will give him a dose of Levemir this am to get off the insulin drip but I expect his glucose to normalize without insulin. See progression orders   LOS: 2 days    Alleen BorneBryan K Karyna Bessler 02/24/2017

## 2017-02-24 NOTE — Progress Notes (Signed)
Patient ID: Jordan Wong, male   DOB: 01/02/1951, 66 y.o.   MRN: [id number] SICU Evening Rounds:  Hemodynamically stable in sinus rhythm.  Ambulating well  Urine output good  BMET    Component Value Date/Time   NA 135 02/24/2017 1647   K 4.5 02/24/2017 1647   CL 97 (L) 02/24/2017 1647   CO2 23 02/24/2017 0355   GLUCOSE 147 (H) 02/24/2017 1647   BUN 10 02/24/2017 1647   CREATININE 0.90 02/24/2017 1647   CALCIUM 7.9 (L) 02/24/2017 0355   GFRNONAA >60 02/24/2017 1632   GFRAA >60 02/24/2017 1632   CBC    Component Value Date/Time   WBC 14.4 (H) 02/24/2017 1632   RBC 3.55 (L) 02/24/2017 1632   HGB 9.9 (L) 02/24/2017 1647   HGB 13.1 02/22/2016 1545   HCT 29.0 (L) 02/24/2017 1647   HCT 38.4 02/22/2016 1545   PLT 113 (L) 02/24/2017 1632   PLT 143 (L) 02/22/2016 1545   MCV 88.5 02/24/2017 1632   MCV 89 02/22/2016 1545   MCH 30.4 02/24/2017 1632   MCHC 34.4 02/24/2017 1632   RDW 14.1 02/24/2017 1632   RDW 13.5 02/22/2016 1545   LYMPHSABS 0.9 02/22/2016 1545   EOSABS 0.1 02/22/2016 1545   BASOSABS 0.0 02/22/2016 1545

## 2017-02-25 ENCOUNTER — Inpatient Hospital Stay (HOSPITAL_COMMUNITY): Payer: Medicare HMO

## 2017-02-25 LAB — CBC
HEMATOCRIT: 29.3 % — AB (ref 39.0–52.0)
HEMOGLOBIN: 10 g/dL — AB (ref 13.0–17.0)
MCH: 29.9 pg (ref 26.0–34.0)
MCHC: 34.1 g/dL (ref 30.0–36.0)
MCV: 87.5 fL (ref 78.0–100.0)
Platelets: 100 10*3/uL — ABNORMAL LOW (ref 150–400)
RBC: 3.35 MIL/uL — ABNORMAL LOW (ref 4.22–5.81)
RDW: 13.9 % (ref 11.5–15.5)
WBC: 13.1 10*3/uL — ABNORMAL HIGH (ref 4.0–10.5)

## 2017-02-25 LAB — BASIC METABOLIC PANEL
Anion gap: 6 (ref 5–15)
BUN: 10 mg/dL (ref 6–20)
CHLORIDE: 101 mmol/L (ref 101–111)
CO2: 26 mmol/L (ref 22–32)
Calcium: 8.3 mg/dL — ABNORMAL LOW (ref 8.9–10.3)
Creatinine, Ser: 0.86 mg/dL (ref 0.61–1.24)
GFR calc Af Amer: >60 mL/min (ref >60)
GFR calc non Af Amer: >60 mL/min (ref >60)
GLUCOSE: 128 mg/dL — AB (ref 65–99)
POTASSIUM: 4.3 mmol/L (ref 3.5–5.1)
Sodium: 133 mmol/L — ABNORMAL LOW (ref 135–145)

## 2017-02-25 LAB — GLUCOSE, CAPILLARY: Glucose-Capillary: 126 mg/dL — ABNORMAL HIGH (ref 65–99)

## 2017-02-25 MED ORDER — TRAMADOL HCL 50 MG PO TABS
50.0000 mg | ORAL_TABLET | ORAL | Status: DC | PRN
  Administered 2017-02-25: 50 mg via ORAL
  Filled 2017-02-25: qty 1

## 2017-02-25 MED ORDER — ASPIRIN EC 325 MG PO TBEC
325.0000 mg | DELAYED_RELEASE_TABLET | Freq: Every day | ORAL | Status: DC
  Administered 2017-02-25 – 2017-02-27 (×3): 325 mg via ORAL
  Filled 2017-02-25 (×3): qty 1

## 2017-02-25 MED ORDER — ONDANSETRON HCL 4 MG/2ML IJ SOLN: 4.0000 mg | Freq: Four times a day (QID) | INTRAMUSCULAR | Status: DC | PRN

## 2017-02-25 MED ORDER — BISACODYL 10 MG RE SUPP: 10.0000 mg | Freq: Every day | RECTAL | Status: DC | PRN

## 2017-02-25 MED ORDER — POTASSIUM CHLORIDE CRYS ER 20 MEQ PO TBCR
20.0000 meq | EXTENDED_RELEASE_TABLET | Freq: Every day | ORAL | Status: AC
  Administered 2017-02-25 – 2017-02-27 (×3): 20 meq via ORAL
  Filled 2017-02-25 (×3): qty 1

## 2017-02-25 MED ORDER — ONDANSETRON HCL 4 MG PO TABS: 4.0000 mg | ORAL_TABLET | Freq: Four times a day (QID) | ORAL | Status: DC | PRN

## 2017-02-25 MED ORDER — MOVING RIGHT ALONG BOOK
Freq: Once | Status: AC
  Administered 2017-02-26: 19:00:00
  Filled 2017-02-25: qty 1

## 2017-02-25 MED ORDER — SODIUM CHLORIDE 0.9 % IV SOLN: 250.0000 mL | INTRAVENOUS | Status: DC | PRN

## 2017-02-25 MED ORDER — ACETAMINOPHEN 325 MG PO TABS
650.0000 mg | ORAL_TABLET | Freq: Four times a day (QID) | ORAL | Status: DC | PRN
  Administered 2017-02-25 – 2017-02-26 (×3): 650 mg via ORAL
  Filled 2017-02-25 (×3): qty 2

## 2017-02-25 MED ORDER — METOPROLOL TARTRATE 12.5 MG HALF TABLET
12.5000 mg | ORAL_TABLET | Freq: Two times a day (BID) | ORAL | Status: DC
Start: 2017-02-25 — End: 2017-02-27
  Administered 2017-02-25 – 2017-02-26 (×4): 12.5 mg via ORAL
  Filled 2017-02-25 (×4): qty 1

## 2017-02-25 MED ORDER — FUROSEMIDE 40 MG PO TABS
40.0000 mg | ORAL_TABLET | Freq: Every day | ORAL | Status: AC
  Administered 2017-02-25 – 2017-02-27 (×3): 40 mg via ORAL
  Filled 2017-02-25 (×3): qty 1

## 2017-02-25 MED ORDER — FAMOTIDINE 20 MG PO TABS
20.0000 mg | ORAL_TABLET | Freq: Two times a day (BID) | ORAL | Status: DC
  Administered 2017-02-25 – 2017-02-27 (×5): 20 mg via ORAL
  Filled 2017-02-25 (×5): qty 1

## 2017-02-25 MED ORDER — BISACODYL 5 MG PO TBEC: 10.0000 mg | DELAYED_RELEASE_TABLET | Freq: Every day | ORAL | Status: DC | PRN

## 2017-02-25 MED ORDER — SODIUM CHLORIDE 0.9% FLUSH
3.0000 mL | Freq: Two times a day (BID) | INTRAVENOUS | Status: DC
Start: 2017-02-25 — End: 2017-02-27
  Administered 2017-02-25 – 2017-02-27 (×4): 3 mL via INTRAVENOUS

## 2017-02-25 MED ORDER — DOCUSATE SODIUM 100 MG PO CAPS
200.0000 mg | ORAL_CAPSULE | Freq: Every day | ORAL | Status: DC
  Administered 2017-02-25 – 2017-02-27 (×3): 200 mg via ORAL
  Filled 2017-02-25 (×3): qty 2

## 2017-02-25 MED ORDER — SODIUM CHLORIDE 0.9% FLUSH: 3.0000 mL | INTRAVENOUS | Status: DC | PRN

## 2017-02-25 MED ORDER — OXYCODONE HCL 5 MG PO TABS: 5.0000 mg | ORAL_TABLET | ORAL | Status: DC | PRN

## 2017-02-25 NOTE — Progress Notes (Signed)
Patient ambulated to from [facility] to new room 2W11 using a wheelchair for stabilization. Patient tolerated ambulation well, sats maintained mid 90s on RA throughout walk.  All belongings and SCDs taken with patient. Wife Verlon AuLeslie at the bedside.  Tammy, RN and a NT at bedside upon arrival.

## 2017-02-25 NOTE — Progress Notes (Signed)
2 Days Post-Op Procedure(s) (LRB): CORONARY ARTERY BYPASS GRAFT times five using Bilateral Mammary arteries and Right leg saphenous vein (N/A) TRANSESOPHAGEAL ECHOCARDIOGRAM (TEE) (N/A) Subjective:  No complaints. Pain under control with Tylenol. Ambulating well  Objective: Vital signs in last 24 hours: Temp:  [97.7 F (36.5 C)-98.6 F (37 C)] 98.6 F (37 C) (06/10 0400) Pulse Rate:  [67-99] 78 (06/10 0700) Cardiac Rhythm: Normal sinus rhythm (06/10 0720) Resp:  [15-26] 18 (06/10 0700) BP: (89-126)/(54-72) 96/62 (06/10 0700) SpO2:  [88 %-100 %] 91 % (06/10 0700) Arterial Line BP: (121-143)/(53-68) 121/53 (06/09 1300) Weight:  [82.4 kg (181 lb 10.5 oz)] 82.4 kg (181 lb 10.5 oz) (06/10 0500)  Hemodynamic parameters for last 24 hours:    Intake/Output from previous day: 06/09 0701 - 06/10 0700 In: 1814.2 [P.O.:1080; I.V.:534.2; IV Piggyback:200] Out: 1600 [Urine:1500; Chest Tube:100] Intake/Output this shift: Total I/O In: 50 [IV Piggyback:50] Out: -   General appearance: alert and cooperative Heart: regular rate and rhythm, S1, S2 normal, no murmur, click, rub or gallop Lungs: clear to auscultation bilaterally Extremities: edema mild Wound: dressing dry  Lab Results:  Recent Labs  02/24/17 1632 02/24/17 1647 02/25/17 0405  WBC 14.4*  --  13.1*  HGB 10.8* 9.9* 10.0*  HCT 31.4* 29.0* 29.3*  PLT 113*  --  100*   BMET:  Recent Labs  02/24/17 0355  02/24/17 1647 02/25/17 0405  NA 136  --  135 133*  K 3.7  --  4.5 4.3  CL 106  --  97* 101  CO2 23  --   --  26  GLUCOSE 118*  --  147* 128*  BUN 8  --  10 10  CREATININE 0.82  < > 0.90 0.86  CALCIUM 7.9*  --   --  8.3*  < > = values in this interval not displayed.  PT/INR:  Recent Labs  02/23/17 1426  LABPROT 16.3*  INR 1.30   ABG    Component Value Date/Time   PHART 7.346 (L) 02/23/2017 1859   HCO3 22.9 02/23/2017 1859   TCO2 27 02/24/2017 1647   ACIDBASEDEF 3.0 (H) 02/23/2017 1859   O2SAT 96.0  02/23/2017 1859   CBG (last 3)   Recent Labs  02/24/17 1312 02/24/17 1942 02/25/17 0407  GLUCAP 145* 130* 126*    Assessment/Plan: S/P Procedure(s) (LRB): CORONARY ARTERY BYPASS GRAFT times five using Bilateral Mammary arteries and Right leg saphenous vein (N/A) TRANSESOPHAGEAL ECHOCARDIOGRAM (TEE) (N/A)  He is hemodynamically stable in sinus rhythm. Continue low dose beta blocker.  Mild volume excess: weight is 8 lbs over preop. Continue diuresis.  Glucose under adequate control. Will stop CBG's  Transfer to 2W and continue mobilization.   LOS: 3 days    Alleen BorneBryan K Bartle 02/25/2017

## 2017-02-26 ENCOUNTER — Encounter (HOSPITAL_COMMUNITY): Payer: Self-pay | Admitting: Surgery

## 2017-02-26 LAB — GLUCOSE, CAPILLARY: GLUCOSE-CAPILLARY: 134 mg/dL — AB (ref 65–99)

## 2017-02-26 MED ORDER — LACTULOSE 10 GM/15ML PO SOLN
20.0000 g | Freq: Once | ORAL | Status: AC
  Administered 2017-02-26: 20 g via ORAL
  Filled 2017-02-26: qty 30

## 2017-02-26 MED FILL — Heparin Sodium (Porcine) Inj 1000 Unit/ML: INTRAMUSCULAR | Qty: 30 | Status: AC

## 2017-02-26 MED FILL — Mannitol IV Soln 20%: INTRAVENOUS | Qty: 500 | Status: AC

## 2017-02-26 MED FILL — Lidocaine HCl IV Inj 20 MG/ML: INTRAVENOUS | Qty: 5 | Status: AC

## 2017-02-26 MED FILL — Potassium Chloride Inj 2 mEq/ML: INTRAVENOUS | Qty: 10 | Status: AC

## 2017-02-26 MED FILL — Sodium Bicarbonate IV Soln 8.4%: INTRAVENOUS | Qty: 50 | Status: AC

## 2017-02-26 MED FILL — Electrolyte-R (PH 7.4) Solution: INTRAVENOUS | Qty: 6000 | Status: AC

## 2017-02-26 MED FILL — Sodium Chloride IV Soln 0.9%: INTRAVENOUS | Qty: 2000 | Status: AC

## 2017-02-26 MED FILL — Heparin Sodium (Porcine) Inj 1000 Unit/ML: INTRAMUSCULAR | Qty: 10 | Status: AC

## 2017-02-26 MED FILL — Magnesium Sulfate Inj 50%: INTRAMUSCULAR | Qty: 10 | Status: AC

## 2017-02-26 NOTE — Progress Notes (Addendum)
Patient EPW pulled per protocol and as ordered.slight oozing from Left ventrical wire, All ends intact. Patient reminded to lie supine approximately one hour. BP 122/64 Will monitor patient. Charley Miske, Randall AnKristin Jessup RN

## 2017-02-26 NOTE — Progress Notes (Addendum)
      [address]       [place] [id number]             [phone]        3 Days Post-Op Procedure(s) (LRB): CORONARY ARTERY BYPASS GRAFT times five using Bilateral Mammary arteries and Right leg saphenous vein (N/A) TRANSESOPHAGEAL ECHOCARDIOGRAM (TEE) (N/A)  Subjective: Patient states his front is cold and his back is hot. He is passing gas but no bowel movement yet.   Objective: Vital signs in last 24 hours: Temp:  [97.9 F (36.6 C)-99.4 F (37.4 C)] 98.5 F (36.9 C) (06/11 0533) Pulse Rate:  [75-104] 91 (06/11 0533) Cardiac Rhythm: Normal sinus rhythm (06/10 2015) Resp:  [15-20] 18 (06/11 0533) BP: (92-130)/(57-79) 130/73 (06/11 0533) SpO2:  [90 %-99 %] 94 % (06/11 0533) Weight:  [79.1 kg (174 lb 6.4 oz)] 79.1 kg (174 lb 6.4 oz) (06/11 0533)  Pre op weight 78.8 kg Current Weight  02/26/17 79.1 kg (174 lb 6.4 oz)    Intake/Output from previous day: 06/10 0701 - 06/11 0700 In: 930 [P.O.:840; I.V.:40; IV Piggyback:50] Out: 825 [Urine:825]   Physical Exam:  Cardiovascular: RRR Pulmonary: Clear to auscultation bilaterally Abdomen: Soft, non tender, bowel sounds present. Extremities: Trace bilateral lower extremity edema. Wounds: Sternal and right lower leg dressing removed and wounds are clean and dry.  No erythema or signs of infection.  Lab Results: CBC: Recent Labs  02/24/17 1632 02/24/17 1647 02/25/17 0405  WBC 14.4*  --  13.1*  HGB 10.8* 9.9* 10.0*  HCT 31.4* 29.0* 29.3*  PLT 113*  --  100*   BMET:  Recent Labs  02/24/17 0355  02/24/17 1647 02/25/17 0405  NA 136  --  135 133*  K 3.7  --  4.5 4.3  CL 106  --  97* 101  CO2 23  --   --  26  GLUCOSE 118*  --  147* 128*  BUN 8  --  10 10  CREATININE 0.82  < > 0.90 0.86  CALCIUM 7.9*  --   --  8.3*  < > = values in this interval not displayed.  PT/INR:  Lab Results  Component Value Date   INR 1.30 02/23/2017   INR 1.13 02/22/2017   INR 1.07 02/21/2017   ABG:   INR: Will add last result for INR, ABG once components are confirmed Will add last 4 CBG results once components are confirmed  Assessment/Plan:  1. CV - SR in the 80's. On Lopressor 12.5 mg bid. 2.  Pulmonary - On room air. Encourage incentive spirometer. 3. Volume Overload - On Lasix 40 mg daily. 4.  Acute blood loss anemia - H and H stable at 10 and 29.3 5. Thrombocytopenia-platelets 100,000. Is on Lovenox for DVT prophylaxis. 6. Remove EPW 7. LOC constipation 8. Possibly discharge in am  ZIMMERMAN,DONIELLE MPA-C 02/26/2017,7:16 AM    Chart reviewed, patient examined, agree with above. He is doing well. Plan home tomorrow if no changes.

## 2017-02-26 NOTE — Progress Notes (Signed)
CARDIAC REHAB PHASE I   PRE:  Rate/Rhythm: up in hall independently    BP: sitting     SaO2:   MODE:  Ambulation: 840 ft   POST:  Rate/Rhythm: 115 ST walking    BP: sitting 104/70     SaO2: 98 RA walking  Pt up in hall independently, I joined him. Pt without c/o, denies SOB. Steady independently, appropriate pace walking. Return to recliner. Gave him instructions for sternal precautions and asked him to call staff to manage recliner. Discussed IS, he is inspiring 1500+ mL. Will f/u tomorrow. [id number]   Harriet MassonRandi Kristan Zoiee Wimmer CES, ACSM 02/26/2017 9:36 AM

## 2017-02-26 NOTE — Discharge Instructions (Signed)
Coronary Artery Bypass Grafting, Care After °These instructions give you information on caring for yourself after your procedure. Your doctor may also give you more specific instructions. Call your doctor if you have any problems or questions after your procedure. °Follow these instructions at home: °· Only take medicine as told by your doctor. Take medicines exactly as told. Do not stop taking medicines or start any new medicines without talking to your doctor first. °· Take your pulse as told by your doctor. °· Do deep breathing as told by your doctor. Use your breathing device (incentive spirometer), if given, to practice deep breathing several times a day. Support your chest with a pillow or your arms when you take deep breaths or cough. °· Keep the area clean, dry, and protected where the surgery cuts (incisions) were made. Remove bandages (dressings) only as told by your doctor. If strips were applied to surgical area, do not take them off. They fall off on their own. °· Check the surgery area daily for puffiness (swelling), redness, or leaking fluid. °· If surgery cuts were made in your legs: °? Avoid crossing your legs. °? Avoid sitting for long periods of time. Change positions every 30 minutes. °? Raise your legs when you are sitting. Place them on pillows. °· Wear stockings that help keep blood clots from forming in your legs (compression stockings). °· Only take sponge baths until your doctor says it is okay to take showers. Pat the surgery area dry. Do not rub the surgery area with a washcloth or towel. Do not bathe, swim, or use a hot tub until your doctor says it is okay. °· Eat foods that are high in fiber. These include raw fruits and vegetables, whole grains, beans, and nuts. Choose lean meats. Avoid canned, processed, and fried foods. °· Drink enough fluids to keep your pee (urine) clear or pale yellow. °· Weigh yourself every day. °· Rest and limit activity as told by your doctor. You may be told  to: °? Stop any activity if you have chest pain, shortness of breath, changes in heartbeat, or dizziness. Get help right away if this happens. °? Move around often for short amounts of time or take short walks as told by your doctor. Gradually become more active. You may need help to strengthen your muscles and build endurance. °? Avoid lifting, pushing, or pulling anything heavier than 10 pounds (4.5 kg) for at least 6 weeks after surgery. °· Do not drive until your doctor says it is okay. °· Ask your doctor when you can go back to work. °· Ask your doctor when you can begin sexual activity again. °· Follow up with your doctor as told. °Contact a doctor if: °· You have puffiness, redness, more pain, or fluid draining from the incision site. °· You have a fever. °· You have puffiness in your ankles or legs. °· You have pain in your legs. °· You gain 2 or more pounds (0.9 kg) a day. °· You feel sick to your stomach (nauseous) or throw up (vomit). °· You have watery poop (diarrhea). °Get help right away if: °· You have chest pain that goes to your jaw or arms. °· You have shortness of breath. °· You have a fast or irregular heartbeat. °· You notice a "clicking" in your breastbone when you move. °· You have numbness or weakness in your arms or legs. °· You feel dizzy or light-headed. °This information is not intended to replace advice given to you by   your health care provider. Make sure you discuss any questions you have with your health care provider. °Document Released: 09/09/2013 Document Revised: 02/10/2016 Document Reviewed: 02/11/2013 °Elsevier Interactive Patient Education © 2017 [employer] Inc. ° °
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
## 2017-02-27 ENCOUNTER — Other Ambulatory Visit: Payer: Self-pay

## 2017-02-27 DIAGNOSIS — E782 Mixed hyperlipidemia: Secondary | ICD-10-CM

## 2017-02-27 MED ORDER — METOPROLOL TARTRATE 25 MG PO TABS
25.0000 mg | ORAL_TABLET | Freq: Two times a day (BID) | ORAL | Status: DC
  Administered 2017-02-27: 25 mg via ORAL
  Filled 2017-02-27: qty 1

## 2017-02-27 MED ORDER — ASPIRIN 325 MG PO TBEC: 325.0000 mg | DELAYED_RELEASE_TABLET | Freq: Every day | ORAL | 0 refills | Status: DC

## 2017-02-27 MED ORDER — OXYCODONE HCL 5 MG PO TABS: 5.0000 mg | ORAL_TABLET | ORAL | 0 refills | Status: DC | PRN

## 2017-02-27 MED ORDER — METOPROLOL TARTRATE 25 MG PO TABS: 25.0000 mg | ORAL_TABLET | Freq: Two times a day (BID) | ORAL | 1 refills | Status: DC

## 2017-02-27 MED ORDER — ATORVASTATIN CALCIUM 40 MG PO TABS: 40.0000 mg | ORAL_TABLET | Freq: Every day | ORAL | 2 refills | Status: DC

## 2017-02-27 NOTE — Progress Notes (Signed)
Ed completed with pt and wife. Good reception. Will send referral to [facility].  [phone] Ethelda ChickKristan Deyonte Cadden CES, ACSM 10:03 AM 02/27/2017

## 2017-02-27 NOTE — Progress Notes (Signed)
      [address]       [place] [id number]             [phone]      4 Days Post-Op Procedure(s) (LRB): CORONARY ARTERY BYPASS GRAFT times five using Bilateral Mammary arteries and Right leg saphenous vein (N/A) TRANSESOPHAGEAL ECHOCARDIOGRAM (TEE) (N/A) Subjective: No issues this morning  Objective: Vital signs in last 24 hours: Temp:  [98.1 F (36.7 C)-99.1 F (37.3 C)] 98.1 F (36.7 C) (06/12 0433) Pulse Rate:  [94-113] 113 (06/12 0433) Cardiac Rhythm: Normal sinus rhythm (06/11 1958) BP: (101-123)/(43-67) 116/62 (06/12 0433) SpO2:  [97 %-98 %] 98 % (06/12 0433) Weight:  [77.7 kg (171 lb 3.2 oz)] 77.7 kg (171 lb 3.2 oz) (06/12 0432)     Intake/Output from previous day: No intake/output data recorded. Intake/Output this shift: No intake/output data recorded.  General appearance: alert, cooperative and no distress Heart: sinus tachycardia Lungs: clear to auscultation bilaterally Abdomen: soft, non-tender; bowel sounds normal; no masses,  no organomegaly Extremities: extremities normal, atraumatic, no cyanosis or edema Wound: clean and dry without drainage  Lab Results:  Recent Labs  02/24/17 1632 02/24/17 1647 02/25/17 0405  WBC 14.4*  --  13.1*  HGB 10.8* 9.9* 10.0*  HCT 31.4* 29.0* 29.3*  PLT 113*  --  100*   BMET:  Recent Labs  02/24/17 1647 02/25/17 0405  NA 135 133*  K 4.5 4.3  CL 97* 101  CO2  --  26  GLUCOSE 147* 128*  BUN 10 10  CREATININE 0.90 0.86  CALCIUM  --  8.3*    PT/INR: No results for input(s): LABPROT, INR in the last 72 hours. ABG    Component Value Date/Time   PHART 7.346 (L) 02/23/2017 1859   HCO3 22.9 02/23/2017 1859   TCO2 27 02/24/2017 1647   ACIDBASEDEF 3.0 (H) 02/23/2017 1859   O2SAT 96.0 02/23/2017 1859   CBG (last 3)   Recent Labs  02/24/17 1942 02/24/17 2301 02/25/17 0407  GLUCAP 130* 134* 126*    Assessment/Plan: S/P Procedure(s) (LRB): CORONARY ARTERY BYPASS GRAFT times five  using Bilateral Mammary arteries and Right leg saphenous vein (N/A) TRANSESOPHAGEAL ECHOCARDIOGRAM (TEE) (N/A)  1. CV-sinus tachycardia. Good blood pressure. Tolerating low-dose lopressor, will increase to 25mg  BID 2. Pulm-On room air. Encouraged incentive spirometer 3. Volume Overload-PO Lasix 40mg  4. Acute blood loss anemia- H and H stable 5. Thrombocytopenia-improving 6. Likely discharge today   LOS: 5 days    Sharlene Doryessa N Dois Juarbe 02/27/2017

## 2017-02-27 NOTE — Care Management Note (Addendum)
Case Management Note Donn PieriniKristi Camia Dipinto RN, BSN Unit 2W-Case Manager [phone]  Patient Details  Name: [name] MRN: [id number] Date of Birth: 02/11/1951  Subjective/Objective:   [place] with [place], M[place]- s/p CABGx5 on  02/23/17               Action/Plan: PTA pt lived at home with spouse-independent-  plan to return home with wife- no CM needs noted for discharge.   Expected Discharge Date:  02/27/17               Expected Discharge Plan:  Home/Self Care  In-House Referral:     Discharge planning Services  CM Consult  Post Acute Care Choice:  NA Choice offered to:  NA  DME Arranged:    DME Agency:     HH Arranged:    HH Agency:     Status of Service:  Completed, signed off  If discussed at Long Length of Stay Meetings, dates discussed:    Discharge Disposition: home/self care   Additional Comments:  Darrold SpanWebster, Tylor Courtwright Hall, RN 02/27/2017, 9:31 AM

## 2017-02-27 NOTE — Telephone Encounter (Signed)
RX for Lipitor 40 mg sent to pharm via epic

## 2017-03-08 ENCOUNTER — Encounter: Payer: Self-pay | Admitting: Nurse Practitioner

## 2017-03-08 ENCOUNTER — Ambulatory Visit (INDEPENDENT_AMBULATORY_CARE_PROVIDER_SITE_OTHER): Payer: Medicare HMO | Admitting: Nurse Practitioner

## 2017-03-08 ENCOUNTER — Ambulatory Visit (INDEPENDENT_AMBULATORY_CARE_PROVIDER_SITE_OTHER): Payer: Self-pay | Admitting: *Deleted

## 2017-03-08 VITALS — BP 112/58 | HR 89 | Ht 68.0 in | Wt 172.8 lb

## 2017-03-08 DIAGNOSIS — I2510 Atherosclerotic heart disease of native coronary artery without angina pectoris: Secondary | ICD-10-CM

## 2017-03-08 DIAGNOSIS — E785 Hyperlipidemia, unspecified: Secondary | ICD-10-CM

## 2017-03-08 DIAGNOSIS — Z951 Presence of aortocoronary bypass graft: Secondary | ICD-10-CM

## 2017-03-08 DIAGNOSIS — I25110 Atherosclerotic heart disease of native coronary artery with unstable angina pectoris: Secondary | ICD-10-CM

## 2017-03-08 DIAGNOSIS — Z4802 Encounter for removal of sutures: Secondary | ICD-10-CM

## 2017-03-08 NOTE — Progress Notes (Signed)
Jordan Wong returns s/p CABG X 5 on 02/23/17. He is doing well at home with his wife as caregiver. 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 Appetite and bowels are good. He is not requiring any narcotics. Sternal incision and right leg evh incision are very well healed. I removed the incisions from four well healed previous chest tube sites. He has already seen the N.P. at cardiology. They are planning a beach trip around the 4th of July and said it was discussed with Dr. Laneta SimmersBartle when he was in the hospital. He will return as scheduled with a CXR.

## 2017-03-08 NOTE — Patient Instructions (Signed)
Medication Instructions:  Your physician recommends that you continue on your current medications as directed. Please refer to the Current Medication list given to you today.   Labwork: Your physician recommends that you return for lab work in: ABOUT 6 WEEKS AT THE MEDICAL MALL.  - ON OR AROUND April 19, 2017. - Please go to the [facility]. You will check in at the front desk to the right as you walk into the atrium. Valet Parking is offered if needed. - YOU WILL NEED TO BE FASTING, DO NOT EAT OR DRINK AFTER MIDNIGHT THE MORNING OF LAB WORK.     Testing/Procedures: none  Follow-Up: Your physician recommends that you schedule a follow-up appointment in: 3 MONTHS WITH DR Mariah MillingGOLLAN.   If you need a refill on your cardiac medications before your next appointment, please call your pharmacy.

## 2017-03-08 NOTE — Progress Notes (Signed)
Office Visit    Patient Name: Jordan Wong Date of Encounter: 03/08/2017  Primary Care Provider:  Jerl MinaHedrick, James, MD Primary Cardiologist:  Concha Se. Gollan, MD   Chief Complaint    66 year old male with a history of hepatitis C, gout, hyperlipidemia, and recent admission with chest pain and finding of CAD, who presents for follow-up, now status post CABG.  Past Medical History    Past Medical History:  Diagnosis Date  . CAD (coronary artery disease)    a. 02/2017 MV: septal, apical, dist antsept ischemia, EF 50%, 1mm ST dep w/ exercise; b. 02/2017 Cath: LM nl, LAD 99p, 50d, D1 90ost, RI 95ost, LCX 5240m/d, RCA 95p, EF 55-65%; c. 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  Marland Kitchen. Hepatitis C 2016   a. with hepatic fibrosis - followed at [facility]; b. s/p therapy - Viekira Pak (ombitasvir, paritaprevir and ritonavir 12.5/75/50mg  plus dasabuvir 250mg ) + ribavirin x 12 wks as part of Prioritize study.  . History of blood transfusion 1987   "related to leg OR"  . History of echocardiogram    a. 02/2017 Echo: EF 55-60%, no rwma, mild MR, mildly dil LA.  Marland Kitchen. History of gout   . HLD (hyperlipidemia)   . Hyperglycemia   . Pneumonia 2000s X 1   Past Surgical History:  Procedure Laterality Date  . CARDIAC CATHETERIZATION  02/22/2017  . CORONARY ARTERY BYPASS GRAFT N/A 02/23/2017   Procedure: CORONARY ARTERY BYPASS GRAFT times five using Bilateral Mammary arteries and Right leg saphenous vein;  Surgeon: Alleen BorneBartle, Bryan K, MD;  Location: [facility] OR;  Service: Open Heart Surgery;  Laterality: N/A;  . EYE SURGERY    . FEMUR FRACTURE SURGERY Left 1987; 1988   "1st one didn't heal right; had to go in & put more bone i it"  . INGUINAL HERNIA REPAIR Bilateral 1970s-1990s  . LEFT HEART CATH AND CORONARY ANGIOGRAPHY N/A 02/22/2017   Procedure: Left Heart Cath and Coronary Angiography;  Surgeon: Iran OuchArida, Muhammad A, MD;  Location: [facility] INVASIVE CV LAB;  Service: Cardiovascular;  Laterality: N/A;  .  RETINAL DETACHMENT SURGERY Right   . TEE WITHOUT CARDIOVERSION N/A 02/23/2017   Procedure: TRANSESOPHAGEAL ECHOCARDIOGRAM (TEE);  Surgeon: Alleen BorneBartle, Bryan K, MD;  [facility];  Service: Open Heart Surgery;  Laterality: N/A;    Allergies  No Known Allergies  History of Present Illness    66 year old male with the above past medical history including hepatitis C [facility]. Other history includes gout and hyperlipidemia. He was recently admitted to [facility] on June 5 with chest pain and mild troponin elevation. His troponin trend was flat. He was admitted and underwent stress testing which revealed septal, apical, and distal anteroseptal ischemia. Echo showed normal LV function. He underwent diagnostic catheterization revealing severe proximal LAD, diagonal, ramus, circumflex, and RCA disease. [facility]. He subsequently underwent CABG 5 on June 8. Postoperative course was uncomplicated. He was discharged home on aspirin, beta blocker, and statin therapy. Since his discharge, he has been doing reasonably well. He has not had any angina or dyspnea. His chest wall has been healing well and he is only using Tylenol at most twice a day and some days not at all. He has noticed some fatigue if he tries to do too much in one day but overall is progressing appropriately. He denies PND, orthopnea, palpitations, dizziness, syncope, edema, or early satiety. His wounds have been healing well. He is  scheduled to see thoracic surgery this afternoon for removal of abdominal sutures. He is considering cardiac rehabilitation.  Home Medications    Prior to Admission medications   Medication Sig Start Date End Date Taking? Authorizing Provider  acetaminophen (TYLENOL) 325 MG tablet Take 2 tablets (650 mg total) by mouth every 6 (six) hours as needed for mild pain (or Fever >/= 101). 02/22/17  Yes Houston Siren, MD  aspirin EC 325 MG EC tablet Take 1  tablet (325 mg total) by mouth daily. 02/27/17  Yes Conte, Tessa N, PA-C  atorvastatin (LIPITOR) 40 MG tablet Take 1 tablet (40 mg total) by mouth daily at 6 PM. 02/27/17  Yes Asa Lente, Tessa N, PA-C  colchicine 0.6 MG tablet Take 0.6 mg by mouth daily as needed.  12/28/16  Yes [provider]  metoprolol tartrate (LOPRESSOR) 25 MG tablet Take 1 tablet (25 mg total) by mouth 2 (two) times daily. 02/27/17  Yes Sharlene Dory, PA-C    Review of Systems    As above, he still has some fatigue especially if he tries to do to much in one day. He was mildly lightheaded yesterday when he was running some errands with his wife. Chest wall pain is continually improving. He denies angina, dyspnea, PND, orthopnea, palpitations, .  Al syncope, edema, or early satietyl other systems reviewed and are otherwise negative except as noted above.  Physical Exam    VS:  BP (!) 112/58 (BP Location: Left Arm, Patient Position: Sitting, Cuff Size: Normal)   Pulse 89   Ht 5\' 8"  (1.727 m)   Wt 172 lb 12 oz (78.4 kg)   BMI 26.27 kg/m  , BMI Body mass index is 26.27 kg/m. GEN: Well nourished, well developed, in no acute distress.  HEENT: normal.  Neck: Supple, no JVD, carotid bruits, or masses. Cardiac: RRR, no murmurs, rubs, or gallops. No clubbing, cyanosis, edema.  Radials/DP/PT 2+ and equal bilaterally. Midsternal surgical incision is healing well without drainage or erythema.  Respiratory:  Respirations regular and unlabored, clear to auscultation bilaterally. GI: Soft, nontender, nondistended, BS + x 4. abdominal incisions are healing well with sutures still intact. No drainage or erythema. MS: no deformity or atrophy. Skin: warm and dry, no rash. Neuro:  Strength and sensation are intact. Psych: Normal affect. Extremities: Right lower leg surgical incision is healing well without erythema or drainage.   Accessory Clinical Findings    ECG - Regular sinus rhythm, 89, left axis deviation, left anterior  fascicular block, no acute changes.  Lab Results  Component Value Date   CHOL 175 02/21/2017   HDL 38 (L) 02/21/2017   LDLCALC 116 (H) 02/21/2017   TRIG 103 02/21/2017   CHOLHDL 4.6 02/21/2017    Lab Results  Component Value Date   ALT 20 02/21/2017   AST 21 02/21/2017   ALKPHOS 50 02/21/2017   BILITOT 0.7 02/21/2017     Assessment & Plan    1.  Coronary artery disease status post coronary artery bypass grafting: Patient recently admitted to [facility] with progressive chest discomfort and mild troponin elevation. He had an abnormal stress test and subsequently underwent catheterization revealing severe multivessel disease. He subsequently underwent 5 vessel bypass at [facility]. Postoperative course was uncomplicated and he has continued to do well as an outpatient. Incisional chest wall pain is improving and manageable. He has not had any angina or dyspnea. He has noted some fatigue when trying to do to much in one day such  as running errands with his wife yesterday. Vital signs and EKG look good today. Surgical incisions are healing well. He remains on aspirin, beta blocker, and statin therapy. We had a good discussion today about the importance of cardiac rehabilitation and he is interested. He has follow-up with thoracic surgery later this morning for suture removal.  2. Hyperlipidemia: LDL was 116 on June 6. LFTs were normal at that time. Lipitor is new for him. Plan follow-up lipids and LFTs in 6 weeks.  3. Hepatitis C: This is followed closely [facility]  4. Disposition: Patient has follow-up with thoracic surgery later today for suture removal. He will see Dr. Laneta Simmers on 7/18.  Follow-up lipids and LFTs in 6 weeks. He is interested in cardiac rehabilitation and will await to hear from them. Follow up with Dr. Mariah Milling in 3 months or sooner if necessary.  Nicolasa Ducking, NP 03/08/2017, 8:26 AM

## 2017-03-12 ENCOUNTER — Other Ambulatory Visit: Payer: Self-pay | Admitting: *Deleted

## 2017-03-12 DIAGNOSIS — I2510 Atherosclerotic heart disease of native coronary artery without angina pectoris: Secondary | ICD-10-CM

## 2017-03-12 MED ORDER — METOPROLOL TARTRATE 25 MG PO TABS: 25.0000 mg | ORAL_TABLET | Freq: Two times a day (BID) | ORAL | 1 refills | Status: DC

## 2017-03-23 NOTE — Addendum Note (Signed)
Addendum  created 03/23/17 [id number] by Sharee HolsterMassagee, Alyssia Heese, MD   Cosign clinical note

## 2017-03-26 ENCOUNTER — Encounter (HOSPITAL_COMMUNITY): Payer: Self-pay | Admitting: Surgery

## 2017-03-26 NOTE — Addendum Note (Signed)
Addendum  created 03/26/17 0900 by Sharee HolsterMassagee, Leola Fiore, MD   Anesthesia Event edited, Anesthesia Staff edited

## 2017-03-29 ENCOUNTER — Encounter: Payer: Medicare HMO | Attending: Cardiovascular Disease | Admitting: *Deleted

## 2017-03-29 ENCOUNTER — Encounter: Payer: Self-pay | Admitting: *Deleted

## 2017-03-29 VITALS — Ht 69.0 in | Wt 173.5 lb

## 2017-03-29 DIAGNOSIS — I2510 Atherosclerotic heart disease of native coronary artery without angina pectoris: Secondary | ICD-10-CM | POA: Diagnosis not present

## 2017-03-29 DIAGNOSIS — Z8701 Personal history of pneumonia (recurrent): Secondary | ICD-10-CM | POA: Insufficient documentation

## 2017-03-29 DIAGNOSIS — Z79899 Other long term (current) drug therapy: Secondary | ICD-10-CM | POA: Insufficient documentation

## 2017-03-29 DIAGNOSIS — Z7982 Long term (current) use of aspirin: Secondary | ICD-10-CM | POA: Insufficient documentation

## 2017-03-29 DIAGNOSIS — M109 Gout, unspecified: Secondary | ICD-10-CM | POA: Diagnosis not present

## 2017-03-29 DIAGNOSIS — Z8619 Personal history of other infectious and parasitic diseases: Secondary | ICD-10-CM | POA: Diagnosis not present

## 2017-03-29 DIAGNOSIS — E785 Hyperlipidemia, unspecified: Secondary | ICD-10-CM | POA: Diagnosis not present

## 2017-03-29 DIAGNOSIS — Z951 Presence of aortocoronary bypass graft: Secondary | ICD-10-CM | POA: Diagnosis present

## 2017-03-29 NOTE — Patient Instructions (Signed)
Patient Instructions  Patient Details  Name: Rosanne Sackaul Suh MRN: [id number] Date of Birth: 02/22/1951 Referring Provider:  Antonieta IbaGollan, Timothy J, MD  Below are the personal goals you chose as well as exercise and nutrition goals. Our goal is to help you keep on track towards obtaining and maintaining your goals. We will be discussing your progress on these goals with you throughout the program.  Initial Exercise Prescription:     Initial Exercise Prescription - 03/29/17 1400      Date of Initial Exercise RX and Referring Provider   Date 03/29/17   Referring Provider Gollan     Treadmill   MPH 2.5   Grade 1   Minutes 15   METs 3.35     Recumbant Bike   Level 3   RPM 60   Watts 34   Minutes 15   METs 3.35     T5 Nustep   Level 3   SPM 80   Minutes 15   METs 3      Exercise Goals: Frequency: Be able to perform aerobic exercise three times per week working toward 3-5 days per week.  Intensity: Work with a perceived exertion of 11 (fairly light) - 15 (hard) as tolerated. Follow your new exercise prescription and watch for changes in prescription as you progress with the program. Changes will be reviewed with you when they are made.  Duration: You should be able to do 30 minutes of continuous aerobic exercise in addition to a 5 minute warm-up and a 5 minute cool-down routine.  Nutrition Goals: Your personal nutrition goals will be established when you do your nutrition analysis with the dietician.  The following are nutrition guidelines to follow: Cholesterol < 200mg /day Sodium < 1500mg /day Fiber: Men over 50 yrs - 30 grams per day  Personal Goals:     Personal Goals and Risk Factors at Admission - 03/29/17 1438      Core Components/Risk Factors/Patient Goals on Admission   Lipids Yes   Intervention Provide education and support for participant on nutrition & aerobic/resistive exercise along with prescribed medications to achieve LDL 70mg , HDL >40mg .   Expected Outcomes  Short Term: Participant states understanding of desired cholesterol values and is compliant with medications prescribed. Participant is following exercise prescription and nutrition guidelines.;Long Term: Cholesterol controlled with medications as prescribed, with individualized exercise RX and with personalized nutrition plan. Value goals: LDL < 70mg , HDL > 40 mg.   Stress Yes   Intervention Offer individual and/or small group education and counseling on adjustment to heart disease, stress management and health-related lifestyle change. Teach and support self-help strategies.;Refer participants experiencing significant psychosocial distress to appropriate mental health specialists for further evaluation and treatment. When possible, include family members and significant others in education/counseling sessions.   Expected Outcomes Short Term: Participant demonstrates changes in health-related behavior, relaxation and other stress management skills, ability to obtain effective social support, and compliance with psychotropic medications if prescribed.;Long Term: Emotional wellbeing is indicated by absence of clinically significant psychosocial distress or social isolation.      Tobacco Use Initial Evaluation: History  Smoking Status  . Never Smoker  Smokeless Tobacco  . Never Used    Copy of goals given to participant.

## 2017-03-29 NOTE — Progress Notes (Signed)
Daily Session Note  Patient Details  Name: Jordan Wong MRN: [id number] Date of Birth: 05/02/1951 Referring Provider:    Encounter Date: 03/29/2017  Check In:     Session Check In - 03/29/17 1435      Check-In   Location [facility]   Staff Present Heath Lark, RN, BSN, CCRP;Tyde Lamison Sherryll Burger, RN Vickki Hearing, BA, ACSM CEP, Exercise Physiologist   Supervising physician immediately available to respond to emergencies See telemetry face sheet for immediately available ER MD   Medication changes reported     No   Fall or balance concerns reported    No   Warm-up and Cool-down Performed as group-led instruction   Resistance Training Performed Yes   VAD Patient? No     Pain Assessment   Currently in Pain? No/denies           Exercise Prescription Changes - 03/29/17 1400      Response to Exercise   Blood Pressure (Admit) 106/68   Blood Pressure (Exercise) 130/70   Blood Pressure (Exit) 108/60   Heart Rate (Admit) 69 bpm   Heart Rate (Exercise) 103 bpm   Heart Rate (Exit) 68 bpm   Rating of Perceived Exertion (Exercise) 13      History  Smoking Status  . Never Smoker  Smokeless Tobacco  . Never Used    Goals Met:  Proper associated with RPD/PD & O2 Sat Independence with exercise equipment Exercise tolerated well Personal goals reviewed No report of cardiac concerns or symptoms Strength training completed today  Goals Unmet:  Not Applicable  Comments: Med Review Completed    Dr. Emily Filbert is Medical Director for [facility].

## 2017-03-29 NOTE — Progress Notes (Signed)
Cardiac Individual Treatment Plan  Patient Details  Name: Jordan Wong MRN: [id number] Date of Birth: 11-Apr-1951 Referring Provider:     Cardiac Rehab from 03/29/2017 in [facility]  Referring Provider  Gollan      Initial Encounter Date:    Cardiac Rehab from 03/29/2017 in [facility] Cardiac [facility] Re[facility]  Date  03/29/17  Referring Provider  Mariah Milling      Visit Diagnosis: S/P CABG x 5  Patient's Home Medications on Admission:  Current Outpatient Prescriptions:  .  acetaminophen (TYLENOL) 325 MG tablet, Take 2 tablets (650 mg total) by mouth every 6 (six) hours as needed for mild pain (or Fever >/= 101)., Disp: , Rfl:  .  aspirin EC 325 MG EC tablet, Take 1 tablet (325 mg total) by mouth daily., Disp: 30 tablet, Rfl: 0 .  atorvastatin (LIPITOR) 40 MG tablet, Take 1 tablet (40 mg total) by mouth daily at 6 PM., Disp: 30 tablet, Rfl: 2 .  colchicine 0.6 MG tablet, Take 0.6 mg by mouth daily as needed. , Disp: , Rfl: 0 .  Melatonin 3 MG TABS, Take by mouth., Disp: , Rfl:  .  metoprolol tartrate (LOPRESSOR) 25 MG tablet, Take 1 tablet (25 mg total) by mouth 2 (two) times daily., Disp: 60 tablet, Rfl: 1  Past Medical History: Past Medical History:  Diagnosis Date  . CAD (coronary artery disease)    a. 02/2017 MV: septal, apical, dist antsept ischemia, EF 50%, 1mm ST dep w/ exercise; b. 02/2017 Cath: LM nl, LAD 99p, 50d, D1 90ost, RI 95ost, LCX 46m/d, RCA 95p, EF 55-65%; c. 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  Marland Kitchen Hepatitis C 2016   a. with hepatic fibrosis - followed at [facility]; b. s/p therapy - Viekira Pak (ombitasvir, paritaprevir and ritonavir 12.5/75/50mg  plus dasabuvir 250mg ) + ribavirin x 12 wks as part of Prioritize study.  . History of blood transfusion 1987   "related to leg OR"  . History of echocardiogram    a. 02/2017 Echo: EF 55-60%, no rwma, mild MR, mildly dil LA.  Marland Kitchen History of gout   . HLD (hyperlipidemia)   . Hyperglycemia    . Pneumonia 2000s X 1    Tobacco Use: History  Smoking Status  . Never Smoker  Smokeless Tobacco  . Never Used    Labs: Recent Review Flowsheet Data    Labs for ITP Cardiac and Pulmonary Rehab Latest Ref Rng & Units 02/23/2017 02/23/2017 02/23/2017 02/23/2017 02/24/2017   Cholestrol 0 - 200 mg/dL - - - - -   LDLCALC 0 - 99 mg/dL - - - - -   HDL >40 mg/dL - - - - -   Trlycerides <150 mg/dL - - - - -   Hemoglobin A1c 4.8 - 5.6 % - - - - -   PHART 7.350 - 7.450 7.355 7.407 7.346(L) - -   PCO2ART 32.0 - 48.0 mmHg 42.7 41.6 41.9 - -   HCO3 20.0 - 28.0 mmol/L 24.0 26.3 22.9 - -   TCO2 0 - 100 mmol/L 25 28 24 25 27    ACIDBASEDEF 0.0 - 2.0 mmol/L 2.0 - 3.0(H) - -   O2SAT % 97.0 97.0 96.0 - -       Exercise Target Goals: Date: 03/29/17  Exercise Program Goal: Individual exercise prescription set with THRR, safety & activity barriers. Participant demonstrates ability to understand and report RPE using BORG scale, to self-measure pulse accurately, and to acknowledge the importance  of the exercise prescription.  Exercise Prescription Goal: Starting with aerobic activity 30 plus minutes a day, 3 days per week for initial exercise prescription. Provide home exercise prescription and guidelines that participant acknowledges understanding prior to discharge.  Activity Barriers & Risk Stratification:     Activity Barriers & Cardiac Risk Stratification - 03/29/17 1442      Activity Barriers & Cardiac Risk Stratification   Activity Barriers None   Cardiac Risk Stratification High      6 Minute Walk:     6 Minute Walk    Row Name 03/29/17 1441         6 Minute Walk   Phase Initial     Distance 1320 feet     Walk Time 6 minutes     # of Rest Breaks 0     MPH 2.5     METS 3.35     RPE 13     VO2 Peak 11.7     Symptoms No     Resting HR 67 bpm     Resting BP 106/68     Max Ex. HR 100 bpm     Max Ex. BP 130/70     2 Minute Post BP 108/60        Oxygen Initial  Assessment:   Oxygen Re-Evaluation:   Oxygen Discharge (Final Oxygen Re-Evaluation):   Initial Exercise Prescription:     Initial Exercise Prescription - 03/29/17 1400      Date of Initial Exercise RX and Referring Provider   Date 03/29/17   Referring Provider Gollan     Treadmill   MPH 2.5   Grade 1   Minutes 15   METs 3.35     Recumbant Bike   Level 3   RPM 60   Watts 34   Minutes 15   METs 3.35     T5 Nustep   Level 3   SPM 80   Minutes 15   METs 3      Perform Capillary Blood Glucose checks as needed.  Exercise Prescription Changes:     Exercise Prescription Changes    Row Name 03/29/17 1400             Response to Exercise   Blood Pressure (Admit) 106/68       Blood Pressure (Exercise) 130/70       Blood Pressure (Exit) 108/60       Heart Rate (Admit) 69 bpm       Heart Rate (Exercise) 103 bpm       Heart Rate (Exit) 68 bpm       Rating of Perceived Exertion (Exercise) 13          Exercise Comments:   Exercise Goals and Review:     Exercise Goals    Row Name 03/29/17 1441             Exercise Goals   Increase Physical Activity Yes       Intervention Provide advice, education, support and counseling about physical activity/exercise needs.;Develop an individualized exercise prescription for aerobic and resistive training based on initial evaluation findings, risk stratification, comorbidities and participant's personal goals.       Expected Outcomes Achievement of increased cardiorespiratory fitness and enhanced flexibility, muscular endurance and strength shown through measurements of functional capacity and personal statement of participant.       Increase Strength and Stamina Yes       Intervention Develop an individualized exercise prescription for aerobic  and resistive training based on initial evaluation findings, risk stratification, comorbidities and participant's personal goals.;Provide advice, education, support and  counseling about physical activity/exercise needs.       Expected Outcomes Achievement of increased cardiorespiratory fitness and enhanced flexibility, muscular endurance and strength shown through measurements of functional capacity and personal statement of participant.          Exercise Goals Re-Evaluation :   Discharge Exercise Prescription (Final Exercise Prescription Changes):     Exercise Prescription Changes - 03/29/17 1400      Response to Exercise   Blood Pressure (Admit) 106/68   Blood Pressure (Exercise) 130/70   Blood Pressure (Exit) 108/60   Heart Rate (Admit) 69 bpm   Heart Rate (Exercise) 103 bpm   Heart Rate (Exit) 68 bpm   Rating of Perceived Exertion (Exercise) 13      Nutrition:  Target Goals: Understanding of nutrition guidelines, daily intake of sodium 1500mg , cholesterol 200mg , calories 30% from fat and 7% or less from saturated fats, daily to have 5 or more servings of fruits and vegetables.  Biometrics:     Pre Biometrics - 03/29/17 1440      Pre Biometrics   Height 5\' 9"  (1.753 m)   Weight 173 lb 8 oz (78.7 kg)   Waist Circumference 39.75 inches   Hip Circumference 39.5 inches   Waist to Hip Ratio 1.01 %   BMI (Calculated) 25.7   Single Leg Stand 16.57 seconds       Nutrition Therapy Plan and Nutrition Goals:   Nutrition Discharge: Rate Your Plate Scores:     Nutrition Assessments - 03/29/17 1439      MEDFICTS Scores   Pre Score 15      Nutrition Goals Re-Evaluation:   Nutrition Goals Discharge (Final Nutrition Goals Re-Evaluation):   Psychosocial: Target Goals: Acknowledge presence or absence of significant depression and/or stress, maximize coping skills, provide positive support system. Participant is able to verbalize types and ability to use techniques and skills needed for reducing stress and depression.   Initial Review & Psychosocial Screening:     Initial Psych Review & Screening - 03/29/17 1439      Initial  Review   Current issues with None Identified     Family Dynamics   Good Support System? Yes  Wife     Barriers   Psychosocial barriers to participate in program There are no identifiable barriers or psychosocial needs.     Screening Interventions   Interventions Encouraged to exercise;Program counselor consult      Quality of Life Scores:      Quality of Life - 03/29/17 1439      Quality of Life Scores   Health/Function Pre 20.83 %   Socioeconomic Pre 20.71 %   Psych/Spiritual Pre 21 %   Family Pre 21 %   GLOBAL Pre 20.87 %      PHQ-9: Recent Review Flowsheet Data    Depression screen [facility] 2/9 03/29/2017   Decreased Interest 0   Down, Depressed, Hopeless 0   PHQ - 2 Score 0   Altered sleeping 1   Tired, decreased energy 1   Change in appetite 0   Feeling bad or failure about yourself  0   Trouble concentrating 0   Moving slowly or fidgety/restless 0   Suicidal thoughts 0   PHQ-9 Score 2   Difficult doing work/chores Not difficult at all     Interpretation of Total Score  Total Score Depression Severity:  1-4 =  Minimal depression, 5-9 = Mild depression, 10-14 = Moderate depression, 15-19 = Moderately severe depression, 20-27 = Severe depression   Psychosocial Evaluation and Intervention:   Psychosocial Re-Evaluation:   Psychosocial Discharge (Final Psychosocial Re-Evaluation):   Vocational Rehabilitation: Provide vocational rehab assistance to qualifying candidates.   Vocational Rehab Evaluation & Intervention:     Vocational Rehab - 03/29/17 1440      Initial Vocational Rehab Evaluation & Intervention   Assessment shows need for Vocational Rehabilitation No      Education: Education Goals: Education classes will be provided on a weekly basis, covering required topics. Participant will state understanding/return demonstration of topics presented.  Learning Barriers/Preferences:     Learning Barriers/Preferences - 03/29/17 1440      Learning  Barriers/Preferences   Learning Barriers None   Learning Preferences Individual Instruction      Education Topics: General Nutrition Guidelines/Fats and Fiber: -Group instruction provided by verbal, written material, models and posters to present the general guidelines for heart healthy nutrition. Gives an explanation and review of dietary fats and fiber.   Controlling Sodium/Reading Food Labels: -Group verbal and written material supporting the discussion of sodium use in heart healthy nutrition. Review and explanation with models, verbal and written materials for utilization of the food label.   Exercise Physiology & Risk Factors: - Group verbal and written instruction with models to review the exercise physiology of the cardiovascular system and associated critical values. Details cardiovascular disease risk factors and the goals associated with each risk factor.   Aerobic Exercise & Resistance Training: - Gives group verbal and written discussion on the health impact of inactivity. On the components of aerobic and resistive training programs and the benefits of this training and how to safely progress through these programs.   Flexibility, Balance, General Exercise Guidelines: - Provides group verbal and written instruction on the benefits of flexibility and balance training programs. Provides general exercise guidelines with specific guidelines to those with heart or lung disease. Demonstration and skill practice provided.   Stress Management: - Provides group verbal and written instruction about the health risks of elevated stress, cause of high stress, and healthy ways to reduce stress.   Depression: - Provides group verbal and written instruction on the correlation between heart/lung disease and depressed mood, treatment options, and the stigmas associated with seeking treatment.   Anatomy & Physiology of the Heart: - Group verbal and written instruction and models provide  basic cardiac anatomy and physiology, with the coronary electrical and arterial systems. Review of: AMI, Angina, Valve disease, Heart Failure, Cardiac Arrhythmia, Pacemakers, and the ICD.   Cardiac Procedures: - Group verbal and written instruction and models to describe the testing methods done to diagnose heart disease. Reviews the outcomes of the test results. Describes the treatment choices: Medical Management, Angioplasty, or Coronary Bypass Surgery.   Cardiac Medications: - Group verbal and written instruction to review commonly prescribed medications for heart disease. Reviews the medication, class of the drug, and side effects. Includes the steps to properly store meds and maintain the prescription regimen.   Go Sex-Intimacy & Heart Disease, Get SMART - Goal Setting: - Group verbal and written instruction through game format to discuss heart disease and the return to sexual intimacy. Provides group verbal and written material to discuss and apply goal setting through the application of the S.M.A.R.T. Method.   Other Matters of the Heart: - Provides group verbal, written materials and models to describe Heart Failure, Angina, Valve Disease, and Diabetes in  the realm of heart disease. Includes description of the disease process and treatment options available to the cardiac patient.   Exercise & Equipment Safety: - Individual verbal instruction and demonstration of equipment use and safety with use of the equipment.   Cardiac Rehab from 03/29/2017 in [facility] Rehab  Date  03/29/17  Educator  [facility]  Instruction Review Code  2- meets goals/outcomes      Infection Prevention: - Provides verbal and written material to individual with discussion of infection control including proper hand washing and proper equipment cleaning during exercise session.   Cardiac Rehab from 03/29/2017 in [facility] Cardiac and Pulmonary Rehab  Date  03/29/17  Educator  [facility]  Instruction Review Code   2- meets goals/outcomes      Falls Prevention: - Provides verbal and written material to individual with discussion of falls prevention and safety.   Cardiac Rehab from 03/29/2017 in [facility] Cardiac and Pulmonary Rehab  Date  03/29/17  Educator  [facility]  Instruction Review Code  2- meets goals/outcomes      Diabetes: - Individual verbal and written instruction to review signs/symptoms of diabetes, desired ranges of glucose level fasting, after meals and with exercise. Advice that pre and post exercise glucose checks will be done for 3 sessions at entry of program.   Cardiac Rehab from 03/29/2017 in [facility] Cardiac and Pulmonary Rehab  Date  03/29/17  Educator  [facility]  Instruction Review Code  2- meets goals/outcomes       Knowledge Questionnaire Score:     Knowledge Questionnaire Score - 03/29/17 1440      Knowledge Questionnaire Score   Pre Score 21/28  Correct answers reviewed with "Pete"       Core Components/Risk Factors/Patient Goals at Admission:     Personal Goals and Risk Factors at Admission - 03/29/17 1438      Core Components/Risk Factors/Patient Goals on Admission   Lipids Yes   Intervention Provide education and support for participant on nutrition & aerobic/resistive exercise along with prescribed medications to achieve LDL 70mg , HDL >40mg .   Expected Outcomes Short Term: Participant states understanding of desired cholesterol values and is compliant with medications prescribed. Participant is following exercise prescription and nutrition guidelines.;Long Term: Cholesterol controlled with medications as prescribed, with individualized exercise RX and with personalized nutrition plan. Value goals: LDL < 70mg , HDL > 40 mg.   Stress Yes   Intervention Offer individual and/or small group education and counseling on adjustment to heart disease, stress management and health-related lifestyle change. Teach and support self-help strategies.;Refer participants experiencing  significant psychosocial distress to appropriate mental health specialists for further evaluation and treatment. When possible, include family members and significant others in education/counseling sessions.   Expected Outcomes Short Term: Participant demonstrates changes in health-related behavior, relaxation and other stress management skills, ability to obtain effective social support, and compliance with psychotropic medications if prescribed.;Long Term: Emotional wellbeing is indicated by absence of clinically significant psychosocial distress or social isolation.      Core Components/Risk Factors/Patient Goals Review:    Core Components/Risk Factors/Patient Goals at Discharge (Final Review):    ITP Comments:     ITP Comments    Row Name 03/29/17 1436           ITP Comments Med review completed. Initial ITP created. Diagnosis can be found in [facility] 6/13          Comments: Med Review Completed

## 2017-03-30 ENCOUNTER — Other Ambulatory Visit: Payer: Self-pay | Admitting: Surgery

## 2017-03-30 DIAGNOSIS — Z951 Presence of aortocoronary bypass graft: Secondary | ICD-10-CM

## 2017-04-02 ENCOUNTER — Ambulatory Visit (INDEPENDENT_AMBULATORY_CARE_PROVIDER_SITE_OTHER): Payer: Self-pay | Admitting: Physician Assistant

## 2017-04-02 ENCOUNTER — Ambulatory Visit
Admission: RE | Admit: 2017-04-02 | Discharge: 2017-04-02 | Disposition: A | Discharge status: Home / Self Care | Payer: Medicare HMO | Source: Ambulatory Visit | Attending: Surgery | Admitting: Surgery

## 2017-04-02 VITALS — BP 104/68 | HR 67 | Resp 20 | Ht 69.0 in | Wt 173.0 lb

## 2017-04-02 DIAGNOSIS — I2510 Atherosclerotic heart disease of native coronary artery without angina pectoris: Secondary | ICD-10-CM

## 2017-04-02 DIAGNOSIS — Z951 Presence of aortocoronary bypass graft: Secondary | ICD-10-CM

## 2017-04-02 DIAGNOSIS — I25110 Atherosclerotic heart disease of native coronary artery with unstable angina pectoris: Secondary | ICD-10-CM

## 2017-04-02 MED ORDER — METOPROLOL TARTRATE 25 MG PO TABS: 12.5000 mg | ORAL_TABLET | Freq: Two times a day (BID) | ORAL | 1 refills | Status: DC

## 2017-04-02 NOTE — Progress Notes (Signed)
  HPI: Patient returns for routine postoperative follow-up having undergone CABG x 4 on 02/23/2017.  The patient's early postoperative recovery while in the hospital was unremarkable. Since hospital discharge the patient reports he is doing well for the most part.  He does have some complaints of itching and fatigue.  He states he has been gradually getting his energy level back and feeling better, but is still tired more than usual.  The itching has been present for the past week. He has no rashes present.  His wife states he has a history of gallstones and she thinks this is the source of the itching.  He is tolerating a heart healthy diet without difficulty.  He denies N/V but has not eaten fatty foods.  His bowel/bladder habits have normalized.   Current Outpatient Prescriptions  Medication Sig Dispense Refill  . acetaminophen (TYLENOL) 325 MG tablet Take 2 tablets (650 mg total) by mouth every 6 (six) hours as needed for mild pain (or Fever >/= 101).    Marland Kitchen. aspirin EC 325 MG EC tablet Take 1 tablet (325 mg total) by mouth daily. 30 tablet 0  . atorvastatin (LIPITOR) 40 MG tablet Take 1 tablet (40 mg total) by mouth daily at 6 PM. 30 tablet 2  . colchicine 0.6 MG tablet Take 0.6 mg by mouth daily as needed.   0  . Melatonin 3 MG TABS Take by mouth.    . metoprolol tartrate (LOPRESSOR) 25 MG tablet Take 0.5 tablets (12.5 mg total) by mouth 2 (two) times daily. 60 tablet 1   No current facility-administered medications for this visit.     Physical Exam:  BP 104/68   Pulse 67   Resp 20   Ht 5\' 9"  (1.753 m)   Wt 173 lb (78.5 kg)   SpO2 97% Comment: RA  BMI 25.55 kg/m   Gen: no apparent distress Heart: RRR Lungs: CTA bilaterally Ext: no edema present Incisions: well healed  Diagnostic Tests:  CXR: no pleural effusions, pneumothorax  A/P:  1. CV- feels fatigued, HR in the 60s, BP in the low 100s- will decrease Lopressor to 12.5 mg BID 2. Activity- as tolerated, patient instructed to  not lift more than 10-15 lbs, can start driving as long as not taking narcotics.  He was instructed he may not resume driving his motorcycle until 12 weeks from date of surgery. 3.Cardiac rehab- ok to start next week 4. Itching- likely related to gallstones, encouraged to follow up with GI/surgeon for evaluation 5. Dispo- RTC prn  Lowella DandyBARRETT, Sayf Kerner, PA-C [facility] [phone]

## 2017-04-04 ENCOUNTER — Encounter: Payer: Self-pay | Admitting: *Deleted

## 2017-04-04 ENCOUNTER — Encounter: Payer: Medicare HMO | Admitting: *Deleted

## 2017-04-04 ENCOUNTER — Ambulatory Visit: Payer: Self-pay | Admitting: Surgery

## 2017-04-04 DIAGNOSIS — Z951 Presence of aortocoronary bypass graft: Secondary | ICD-10-CM

## 2017-04-04 NOTE — Progress Notes (Signed)
Cardiac Individual Treatment Plan  Patient Details  Name: Jordan Wong MRN: [id number] Date of Birth: [birth year] Referring Provider:     Cardiac Rehab from 03/29/2017 in [facility] Cardiac and Pulmonary Rehab  Referring Provider  Gollan      Initial Encounter Date:    Cardiac Rehab from 03/29/2017 in [facility] Cardiac and Pulmonary Rehab  Date  03/29/17  Referring Provider  Rockey Situ      Visit Diagnosis: S/P CABG x 5  Patient's Home Medications on Admission:  Current Outpatient Prescriptions:  .  acetaminophen (TYLENOL) 325 MG tablet, Take 2 tablets (650 mg total) by mouth every 6 (six) hours as needed for mild pain (or Fever >/= 101)., Disp: , Rfl:  .  aspirin EC 325 MG EC tablet, Take 1 tablet (325 mg total) by mouth daily., Disp: 30 tablet, Rfl: 0 .  atorvastatin (LIPITOR) 40 MG tablet, Take 1 tablet (40 mg total) by mouth daily at 6 PM., Disp: 30 tablet, Rfl: 2 .  colchicine 0.6 MG tablet, Take 0.6 mg by mouth daily as needed. , Disp: , Rfl: 0 .  Melatonin 3 MG TABS, Take by mouth., Disp: , Rfl:  .  metoprolol tartrate (LOPRESSOR) 25 MG tablet, Take 0.5 tablets (12.5 mg total) by mouth 2 (two) times daily., Disp: 60 tablet, Rfl: 1  Past Medical History: Past Medical History:  Diagnosis Date  . CAD (coronary artery disease)    a. 02/2017 MV: septal, apical, dist antsept ischemia, EF 50%, 73m ST dep w/ exercise; b. 02/2017 Cath: LM nl, LAD 99p, 50d, D1 90ost, RI 95ost, LCX 767m, RCA 95p, EF 55-65%; c. 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  . Marland Kitchenepatitis C 2016   a. with hepatic fibrosis - followed at [facility]; b. s/p therapy - Viekira Pak (ombitasvir, paritaprevir and ritonavir 12.5/75/5086mlus dasabuvir 250m57m ribavirin x 12 wks as part of Prioritize study.  . History of blood transfusion 1987   "related to leg OR"  . History of echocardiogram    a. 02/2017 Echo: EF 55-60%, no rwma, mild MR, mildly dil LA.  . HiMarland Kitchentory of gout   . HLD (hyperlipidemia)   .  Hyperglycemia   . Pneumonia 2000s X 1    Tobacco Use: History  Smoking Status  . Never Smoker  Smokeless Tobacco  . Never Used    Labs: Recent Review Flowsheet Data    Labs for ITP Cardiac and Pulmonary Rehab Latest Ref Rng & Units 02/23/2017 02/23/2017 02/23/2017 02/23/2017 02/24/2017   Cholestrol 0 - 200 mg/dL - - - - -   LDLCALC 0 - 99 mg/dL - - - - -   HDL >40 mg/dL - - - - -   Trlycerides <150 mg/dL - - - - -   Hemoglobin A1c 4.8 - 5.6 % - - - - -   PHART 7.350 - 7.450 7.355 7.407 7.346(L) - -   PCO2ART 32.0 - 48.0 mmHg 42.7 41.6 41.9 - -   HCO3 20.0 - 28.0 mmol/L 24.0 26.3 22.9 - -   TCO2 0 - 100 mmol/L _0 ACIDBASEDEF 0.0 - 2.0 mmol/L 2.0 - 3.0(H) - -   O2SAT % 97.0 97.0 96.0 - -       Exercise Target Goals:    Exercise Program Goal: Individual exercise prescription set with THRR, safety & activity barriers. Participant demonstrates ability to understand and report RPE using BORG scale, to self-measure pulse accurately, and to acknowledge the importance  of the exercise prescription.  Exercise Prescription Goal: Starting with aerobic activity 30 plus minutes a day, 3 days per week for initial exercise prescription. Provide home exercise prescription and guidelines that participant acknowledges understanding prior to discharge.  Activity Barriers & Risk Stratification:     Activity Barriers & Cardiac Risk Stratification - 03/29/17 1442      Activity Barriers & Cardiac Risk Stratification   Activity Barriers None   Cardiac Risk Stratification High      6 Minute Walk:     6 Minute Walk    Row Name 03/29/17 1441         6 Minute Walk   Phase Initial     Distance 1320 feet     Walk Time 6 minutes     # of Rest Breaks 0     MPH 2.5     METS 3.35     RPE 13     VO2 Peak 11.7     Symptoms No     Resting HR 67 bpm     Resting BP 106/68     Max Ex. HR 100 bpm     Max Ex. BP 130/70     2 Minute Post BP 108/60        Oxygen Initial  Assessment:   Oxygen Re-Evaluation:   Oxygen Discharge (Final Oxygen Re-Evaluation):   Initial Exercise Prescription:     Initial Exercise Prescription - 03/29/17 1400      Date of Initial Exercise RX and Referring Provider   Date 03/29/17   Referring Provider Gollan     Treadmill   MPH 2.5   Grade 1   Minutes 15   METs 3.35     Recumbant Bike   Level 3   RPM 60   Watts 34   Minutes 15   METs 3.35     T5 Nustep   Level 3   SPM 80   Minutes 15   METs 3      Perform Capillary Blood Glucose checks as needed.  Exercise Prescription Changes:     Exercise Prescription Changes    Row Name 03/29/17 1400             Response to Exercise   Blood Pressure (Admit) 106/68       Blood Pressure (Exercise) 130/70       Blood Pressure (Exit) 108/60       Heart Rate (Admit) 69 bpm       Heart Rate (Exercise) 103 bpm       Heart Rate (Exit) 68 bpm       Rating of Perceived Exertion (Exercise) 13          Exercise Comments:   Exercise Goals and Review:     Exercise Goals    Row Name 03/29/17 1441             Exercise Goals   Increase Physical Activity Yes       Intervention Provide advice, education, support and counseling about physical activity/exercise needs.;Develop an individualized exercise prescription for aerobic and resistive training based on initial evaluation findings, risk stratification, comorbidities and participant's personal goals.       Expected Outcomes Achievement of increased cardiorespiratory fitness and enhanced flexibility, muscular endurance and strength shown through measurements of functional capacity and personal statement of participant.       Increase Strength and Stamina Yes       Intervention Develop an individualized exercise prescription for aerobic  and resistive training based on initial evaluation findings, risk stratification, comorbidities and participant's personal goals.;Provide advice, education, support and  counseling about physical activity/exercise needs.       Expected Outcomes Achievement of increased cardiorespiratory fitness and enhanced flexibility, muscular endurance and strength shown through measurements of functional capacity and personal statement of participant.          Exercise Goals Re-Evaluation :   Discharge Exercise Prescription (Final Exercise Prescription Changes):     Exercise Prescription Changes - 03/29/17 1400      Response to Exercise   Blood Pressure (Admit) 106/68   Blood Pressure (Exercise) 130/70   Blood Pressure (Exit) 108/60   Heart Rate (Admit) 69 bpm   Heart Rate (Exercise) 103 bpm   Heart Rate (Exit) 68 bpm   Rating of Perceived Exertion (Exercise) 13      Nutrition:  Target Goals: Understanding of nutrition guidelines, daily intake of sodium 1500mg , cholesterol 200mg , calories 30% from fat and 7% or less from saturated fats, daily to have 5 or more servings of fruits and vegetables.  Biometrics:     Pre Biometrics - 03/29/17 1440      Pre Biometrics   Height 5\' 9"  (1.753 m)   Weight 173 lb 8 oz (78.7 kg)   Waist Circumference 39.75 inches   Hip Circumference 39.5 inches   Waist to Hip Ratio 1.01 %   BMI (Calculated) 25.7   Single Leg Stand 16.57 seconds       Nutrition Therapy Plan and Nutrition Goals:   Nutrition Discharge: Rate Your Plate Scores:     Nutrition Assessments - 03/29/17 1439      MEDFICTS Scores   Pre Score 15      Nutrition Goals Re-Evaluation:   Nutrition Goals Discharge (Final Nutrition Goals Re-Evaluation):   Psychosocial: Target Goals: Acknowledge presence or absence of significant depression and/or stress, maximize coping skills, provide positive support system. Participant is able to verbalize types and ability to use techniques and skills needed for reducing stress and depression.   Initial Review & Psychosocial Screening:     Initial Psych Review & Screening - 03/29/17 1439      Initial  Review   Current issues with None Identified     Family Dynamics   Good Support System? Yes  Wife     Barriers   Psychosocial barriers to participate in program There are no identifiable barriers or psychosocial needs.     Screening Interventions   Interventions Encouraged to exercise;Program counselor consult      Quality of Life Scores:      Quality of Life - 03/29/17 1439      Quality of Life Scores   Health/Function Pre 20.83 %   Socioeconomic Pre 20.71 %   Psych/Spiritual Pre 21 %   Family Pre 21 %   GLOBAL Pre 20.87 %      PHQ-9: Recent Review Flowsheet Data    Depression screen [facility] 2/9 03/29/2017   Decreased Interest 0   Down, Depressed, Hopeless 0   PHQ - 2 Score 0   Altered sleeping 1   Tired, decreased energy 1   Change in appetite 0   Feeling bad or failure about yourself  0   Trouble concentrating 0   Moving slowly or fidgety/restless 0   Suicidal thoughts 0   PHQ-9 Score 2   Difficult doing work/chores Not difficult at all     Interpretation of Total Score  Total Score Depression Severity:  1-4 =  Minimal depression, 5-9 = Mild depression, 10-14 = Moderate depression, 15-19 = Moderately severe depression, 20-27 = Severe depression   Psychosocial Evaluation and Intervention:   Psychosocial Re-Evaluation:   Psychosocial Discharge (Final Psychosocial Re-Evaluation):   Vocational Rehabilitation: Provide vocational rehab assistance to qualifying candidates.   Vocational Rehab Evaluation & Intervention:     Vocational Rehab - 03/29/17 1440      Initial Vocational Rehab Evaluation & Intervention   Assessment shows need for Vocational Rehabilitation No      Education: Education Goals: Education classes will be provided on a weekly basis, covering required topics. Participant will state understanding/return demonstration of topics presented.  Learning Barriers/Preferences:     Learning Barriers/Preferences - 03/29/17 1440      Learning  Barriers/Preferences   Learning Barriers None   Learning Preferences Individual Instruction      Education Topics: General Nutrition Guidelines/Fats and Fiber: -Group instruction provided by verbal, written material, models and posters to present the general guidelines for heart healthy nutrition. Gives an explanation and review of dietary fats and fiber.   Controlling Sodium/Reading Food Labels: -Group verbal and written material supporting the discussion of sodium use in heart healthy nutrition. Review and explanation with models, verbal and written materials for utilization of the food label.   Exercise Physiology & Risk Factors: - Group verbal and written instruction with models to review the exercise physiology of the cardiovascular system and associated critical values. Details cardiovascular disease risk factors and the goals associated with each risk factor.   Aerobic Exercise & Resistance Training: - Gives group verbal and written discussion on the health impact of inactivity. On the components of aerobic and resistive training programs and the benefits of this training and how to safely progress through these programs.   Flexibility, Balance, General Exercise Guidelines: - Provides group verbal and written instruction on the benefits of flexibility and balance training programs. Provides general exercise guidelines with specific guidelines to those with heart or lung disease. Demonstration and skill practice provided.   Stress Management: - Provides group verbal and written instruction about the health risks of elevated stress, cause of high stress, and healthy ways to reduce stress.   Depression: - Provides group verbal and written instruction on the correlation between heart/lung disease and depressed mood, treatment options, and the stigmas associated with seeking treatment.   Anatomy & Physiology of the Heart: - Group verbal and written instruction and models provide  basic cardiac anatomy and physiology, with the coronary electrical and arterial systems. Review of: AMI, Angina, Valve disease, Heart Failure, Cardiac Arrhythmia, Pacemakers, and the ICD.   Cardiac Procedures: - Group verbal and written instruction and models to describe the testing methods done to diagnose heart disease. Reviews the outcomes of the test results. Describes the treatment choices: Medical Management, Angioplasty, or Coronary Bypass Surgery.   Cardiac Medications: - Group verbal and written instruction to review commonly prescribed medications for heart disease. Reviews the medication, class of the drug, and side effects. Includes the steps to properly store meds and maintain the prescription regimen.   Go Sex-Intimacy & Heart Disease, Get SMART - Goal Setting: - Group verbal and written instruction through game format to discuss heart disease and the return to sexual intimacy. Provides group verbal and written material to discuss and apply goal setting through the application of the S.M.A.R.T. Method.   Other Matters of the Heart: - Provides group verbal, written materials and models to describe Heart Failure, Angina, Valve Disease, and Diabetes in  the realm of heart disease. Includes description of the disease process and treatment options available to the cardiac patient.   Exercise & Equipment Safety: - Individual verbal instruction and demonstration of equipment use and safety with use of the equipment.   [facility]  Date  03/29/17  [facility]  Instruction Review Code  2- meets goals/outcomes      Infection Prevention: - Provides verbal and written material to individual with discussion of infection control including proper hand washing and proper equipment cleaning during exercise session.   [facility]  Date  03/29/17  Educator  [facility]  Instruction Review Code   2- meets goals/outcomes      Falls Prevention: - Provides verbal and written material to individual with discussion of falls prevention and safety.   [facility]  Date  03/29/17  [facility]  Instruction Review Code  2- meets goals/outcomes      Diabetes: - Individual verbal and written instruction to review signs/symptoms of diabetes, desired ranges of glucose level fasting, after meals and with exercise. Advice that pre and post exercise glucose checks will be done for 3 sessions at entry of program.   [facility]  Date  03/29/17  [facility]  [facility]  Instruction Review Code  2- meets goals/outcomes       Knowledge Questionnaire Score:     Knowledge Questionnaire Score - 03/29/17 1440      Knowledge Questionnaire Score   Pre Score 21/28  Correct answers reviewed with "Pete"       Core Components/Risk Factors/Patient Goals at Admission:     Personal Goals and Risk Factors at Admission - 03/29/17 1438      Core Components/Risk Factors/Patient Goals on Admission   Lipids Yes   Intervention Provide education and support for participant on nutrition & aerobic/resistive exercise along with prescribed medications to achieve LDL 70mg , HDL >40mg .   Expected Outcomes Short Term: Participant states understanding of desired cholesterol values and is compliant with medications prescribed. Participant is following exercise prescription and nutrition guidelines.;Long Term: Cholesterol controlled with medications as prescribed, with individualized exercise RX and with personalized nutrition plan. Value goals: LDL < 70mg , HDL > 40 mg.   Stress Yes   Intervention Offer individual and/or small group education and counseling on adjustment to heart disease, stress management and health-related lifestyle change. Teach and support self-help strategies.;Refer participants experiencing  significant psychosocial distress to appropriate mental health specialists for further evaluation and treatment. When possible, include family members and significant others in education/counseling sessions.   Expected Outcomes Short Term: Participant demonstrates changes in health-related behavior, relaxation and other stress management skills, ability to obtain effective social support, and compliance with psychotropic medications if prescribed.;Long Term: Emotional wellbeing is indicated by absence of clinically significant psychosocial distress or social isolation.      Core Components/Risk Factors/Patient Goals Review:    Core Components/Risk Factors/Patient Goals at Discharge (Final Review):    ITP Comments:     ITP Comments    Row Name 03/29/17 1436 04/04/17 0716         ITP Comments Med review completed. Initial ITP created. [facility]. Continue with ITP unless directed changes per Medical Director review    New to Program  Comments:

## 2017-04-04 NOTE — Progress Notes (Signed)
Daily Session Note  Patient Details  Name: Jordan Wong MRN: [id number] Date of Birth: 1951-09-17 Referring Provider:     Cardiac Rehab from 03/29/2017 in [facility] Cardiac and Pulmonary Rehab  Referring Provider  Gollan      Encounter Date: 04/04/2017  Check In:     Session Check In - 04/04/17 1612      Check-In   Location [facility]-Cardiac & Pulmonary Rehab   Staff Present Nyoka Cowden, RN, BSN, MA;Sekai Gitlin Sherryll Burger, RN Vickki Hearing, BA, ACSM CEP, Exercise Physiologist   Supervising physician immediately available to respond to emergencies See telemetry face sheet for immediately available ER MD   Medication changes reported     No   Fall or balance concerns reported    No   Warm-up and Cool-down Performed on first and last piece of equipment   Resistance Training Performed Yes   VAD Patient? No     Pain Assessment   Currently in Pain? No/denies         History  Smoking Status  . Never Smoker  Smokeless Tobacco  . Never Used    Goals Met:  Proper associated with RPD/PD & O2 Sat Independence with exercise equipment Exercise tolerated well Personal goals reviewed No report of cardiac concerns or symptoms Strength training completed today  Goals Unmet:  Not Applicable  Comments: First full day of exercise!  Patient was oriented to gym and equipment including functions, settings, policies, and procedures.  Patient's individual exercise prescription and treatment plan were reviewed.  All starting workloads were established based on the results of the 6 minute walk test done at initial orientation visit.  The plan for exercise progression was also introduced and progression will be customized based on patient's performance and goals.    Dr. Emily Filbert is Medical Director for [place] and [facility].

## 2017-04-05 DIAGNOSIS — Z951 Presence of aortocoronary bypass graft: Secondary | ICD-10-CM | POA: Diagnosis not present

## 2017-04-05 NOTE — Progress Notes (Signed)
Daily Session Note  Patient Details  Name: Jordan Wong MRN: [id number] Date of Birth: 26-Jul-1951 Referring Provider:     Cardiac Rehab from 03/29/2017 in [facility] Cardiac and Pulmonary Rehab  Referring Provider  Gollan      Encounter Date: 04/05/2017  Check In:     Session Check In - 04/05/17 1631      Check-In   Location [facility]-Cardiac & Pulmonary Rehab   Staff Present Earlean Shawl, BS, ACSM CEP, Exercise Physiologist;Xandrea Clarey Alcus Dad, RN BSN   Supervising physician immediately available to respond to emergencies See telemetry face sheet for immediately available ER MD   Medication changes reported     No   Fall or balance concerns reported    No   Warm-up and Cool-down Performed on first and last piece of equipment   Resistance Training Performed Yes   VAD Patient? No     Pain Assessment   Currently in Pain? No/denies   Multiple Pain Sites No         History  Smoking Status  . Never Smoker  Smokeless Tobacco  . Never Used    Goals Met:  Independence with exercise equipment Exercise tolerated well No report of cardiac concerns or symptoms Strength training completed today  Goals Unmet:  Not Applicable  Comments: Pt able to follow exercise prescription today without complaint.  Will continue to monitor for progression.   Dr. Emily Filbert is Medical Director for [place] and [facility].

## 2017-04-09 DIAGNOSIS — Z951 Presence of aortocoronary bypass graft: Secondary | ICD-10-CM

## 2017-04-09 NOTE — Progress Notes (Signed)
Daily Session Note  Patient Details  Name: Jordan Wong MRN: [id number] Date of Birth: 1951-06-23 Referring Provider:     Cardiac Rehab from 03/29/2017 in [facility] Cardiac and Pulmonary Rehab  Referring Provider  Gollan      Encounter Date: 04/09/2017  Check In:     Session Check In - 04/09/17 1716      Check-In   Location [facility]-Cardiac & Pulmonary Rehab   Staff Present Nada Maclachlan, BA, ACSM CEP, Exercise Physiologist;Kelly Amedeo Plenty, BS, ACSM CEP, Exercise Physiologist;Meredith Sherryll Burger, RN BSN   Supervising physician immediately available to respond to emergencies See telemetry face sheet for immediately available ER MD   Medication changes reported     No   Fall or balance concerns reported    No   Warm-up and Cool-down Performed on first and last piece of equipment   Resistance Training Performed Yes   VAD Patient? No     Pain Assessment   Currently in Pain? No/denies         History  Smoking Status  . Never Smoker  Smokeless Tobacco  . Never Used    Goals Met:  Independence with exercise equipment Exercise tolerated well No report of cardiac concerns or symptoms Strength training completed today  Goals Unmet:  Not Applicable  Comments: Pt able to follow exercise prescription today without complaint.  Will continue to monitor for progression.    Dr. Emily Filbert is Medical Director for [place] and [facility].

## 2017-04-11 ENCOUNTER — Encounter: Payer: Medicare HMO | Admitting: *Deleted

## 2017-04-11 DIAGNOSIS — Z951 Presence of aortocoronary bypass graft: Secondary | ICD-10-CM | POA: Diagnosis not present

## 2017-04-11 NOTE — Progress Notes (Signed)
Daily Session Note  Patient Details  Name: Jordan Wong MRN: [id number] Date of Birth: 1951/03/23 Referring Provider:     Cardiac Rehab from 03/29/2017 in [facility] Cardiac and Pulmonary Rehab  Referring Provider  Gollan      Encounter Date: 04/11/2017  Check In:     Session Check In - 04/11/17 1621      Check-In   Location [facility]-Cardiac & Pulmonary Rehab   Staff Present Renita Papa, RN Vickki Hearing, BA, ACSM CEP, Exercise Physiologist;Carroll Enterkin, RN, BSN   Supervising physician immediately available to respond to emergencies See telemetry face sheet for immediately available ER MD   Medication changes reported     No   Fall or balance concerns reported    No   Warm-up and Cool-down Performed on first and last piece of equipment   Resistance Training Performed Yes   VAD Patient? No     Pain Assessment   Currently in Pain? No/denies           Exercise Prescription Changes - 04/11/17 1200      Response to Exercise   Blood Pressure (Admit) 124/82   Blood Pressure (Exercise) 134/66   Blood Pressure (Exit) 110/70   Heart Rate (Admit) 66 bpm   Heart Rate (Exercise) 113 bpm   Heart Rate (Exit) 73 bpm   Rating of Perceived Exertion (Exercise) 13   Symptoms none   Duration Continue with 45 min of aerobic exercise without signs/symptoms of physical distress.   Intensity THRR unchanged     Progression   Progression Continue to progress workloads to maintain intensity without signs/symptoms of physical distress.   Average METs 3.35     Resistance Training   Weight 3   Reps 10-15     Interval Training   Interval Training No     Recumbant Bike   Level 3   Watts 34   Minutes 15   METs 3.35     T5 Nustep   Level 6   Minutes 15      History  Smoking Status  . Never Smoker  Smokeless Tobacco  . Never Used    Goals Met:  Proper associated with RPD/PD & O2 Sat Exercise tolerated well No report of cardiac concerns or symptoms Strength training  completed today  Goals Unmet:  Not Applicable  Comments: Pt able to follow exercise prescription today without complaint.  Will continue to monitor for progression.    Dr. Emily Filbert is Medical Director for [place] and [facility].

## 2017-04-12 DIAGNOSIS — Z951 Presence of aortocoronary bypass graft: Secondary | ICD-10-CM | POA: Diagnosis not present

## 2017-04-12 NOTE — Progress Notes (Signed)
Daily Session Note  Patient Details  Name: [name] MRN: [id number] Date of Birth: [birth year] Referring Provider:     Cardiac Rehab from 03/29/2017 in [facility] Cardiac and Pulmonary Rehab  Referring Provider  Gollan      Encounter Date: 04/12/2017  Check In:     Session Check In - 04/12/17 1655      Check-In   Location [facility]-Cardiac & Pulmonary Rehab   Staff Present Gerlene Burdock, RN, Vickki Hearing, BA, ACSM CEP, Exercise Physiologist;Kelly Amedeo Plenty, BS, ACSM CEP, Exercise Physiologist;Shakaria Raphael Flavia Shipper   Supervising physician immediately available to respond to emergencies See telemetry face sheet for immediately available ER MD   Medication changes reported     No   Fall or balance concerns reported    No   Warm-up and Cool-down Performed on first and last piece of equipment   Resistance Training Performed Yes   VAD Patient? No     Pain Assessment   Currently in Pain? No/denies   Multiple Pain Sites No         History  Smoking Status  . Never Smoker  Smokeless Tobacco  . Never Used    Goals Met:  Independence with exercise equipment Exercise tolerated well No report of cardiac concerns or symptoms Strength training completed today  Goals Unmet:  Not Applicable  Comments: Pt able to follow exercise prescription today without complaint.  Will continue to monitor for progression.   Dr. Emily Filbert is Medical Director for [place] and [facility].

## 2017-04-16 DIAGNOSIS — Z951 Presence of aortocoronary bypass graft: Secondary | ICD-10-CM

## 2017-04-16 NOTE — Progress Notes (Signed)
Daily Session Note  Patient Details  Name: Jordan Wong MRN: [id number] Date of Birth: January 06, 1951 Referring Provider:     Cardiac Rehab from 03/29/2017 in [facility] Cardiac and Pulmonary Rehab  Referring Provider  Gollan      Encounter Date: 04/16/2017  Check In:     Session Check In - 04/16/17 1715      Check-In   Location [facility]-Cardiac & Pulmonary Rehab   Staff Present Nada Maclachlan, BA, ACSM CEP, Exercise Physiologist;Kelly Amedeo Plenty, BS, ACSM CEP, Exercise Physiologist;Meredith Sherryll Burger, RN BSN   Supervising physician immediately available to respond to emergencies See telemetry face sheet for immediately available ER MD   Medication changes reported     No   Fall or balance concerns reported    No   Warm-up and Cool-down Performed on first and last piece of equipment   Resistance Training Performed Yes   VAD Patient? No     Pain Assessment   Currently in Pain? No/denies         History  Smoking Status  . Never Smoker  Smokeless Tobacco  . Never Used    Goals Met:  Independence with exercise equipment Exercise tolerated well No report of cardiac concerns or symptoms Strength training completed today  Goals Unmet:  Not Applicable  Comments: Pt able to follow exercise prescription today without complaint.  Will continue to monitor for progression.    Dr. Emily Filbert is Medical Director for [place] and [facility].

## 2017-04-18 ENCOUNTER — Encounter: Payer: Medicare HMO | Attending: Cardiovascular Disease | Admitting: *Deleted

## 2017-04-18 DIAGNOSIS — M109 Gout, unspecified: Secondary | ICD-10-CM | POA: Diagnosis not present

## 2017-04-18 DIAGNOSIS — Z8619 Personal history of other infectious and parasitic diseases: Secondary | ICD-10-CM | POA: Insufficient documentation

## 2017-04-18 DIAGNOSIS — Z79899 Other long term (current) drug therapy: Secondary | ICD-10-CM | POA: Insufficient documentation

## 2017-04-18 DIAGNOSIS — Z7982 Long term (current) use of aspirin: Secondary | ICD-10-CM | POA: Diagnosis not present

## 2017-04-18 DIAGNOSIS — Z8701 Personal history of pneumonia (recurrent): Secondary | ICD-10-CM | POA: Diagnosis not present

## 2017-04-18 DIAGNOSIS — E785 Hyperlipidemia, unspecified: Secondary | ICD-10-CM | POA: Diagnosis not present

## 2017-04-18 DIAGNOSIS — I2510 Atherosclerotic heart disease of native coronary artery without angina pectoris: Secondary | ICD-10-CM | POA: Diagnosis not present

## 2017-04-18 DIAGNOSIS — Z951 Presence of aortocoronary bypass graft: Secondary | ICD-10-CM | POA: Insufficient documentation

## 2017-04-18 NOTE — Progress Notes (Signed)
Daily Session Note  Patient Details  Name: Jordan Wong MRN: [id number] Date of Birth: May 20, 1951 Referring Provider:     Cardiac Rehab from 03/29/2017 in [facility] Cardiac and Pulmonary Rehab  Referring Provider  Gollan      Encounter Date: 04/18/2017  Check In:     Session Check In - 04/18/17 1717      Check-In   Location [facility]-Cardiac & Pulmonary Rehab   Staff Present Gerlene Burdock, RN, BSN;Meredith Sherryll Burger, RN Vickki Hearing, BA, ACSM CEP, Exercise Physiologist   Supervising physician immediately available to respond to emergencies See telemetry face sheet for immediately available ER MD   Medication changes reported     No   Fall or balance concerns reported    No   Tobacco Cessation No Change   Warm-up and Cool-down Performed on first and last piece of equipment   Resistance Training Performed Yes   VAD Patient? No     Pain Assessment   Currently in Pain? No/denies         History  Smoking Status  . Never Smoker  Smokeless Tobacco  . Never Used    Goals Met:  Proper associated with RPD/PD & O2 Sat Exercise tolerated well No report of cardiac concerns or symptoms Strength training completed today  Goals Unmet:  Not Applicable  Comments:     Dr. Emily Filbert is Medical Director for [place] and [facility].

## 2017-04-19 DIAGNOSIS — Z951 Presence of aortocoronary bypass graft: Secondary | ICD-10-CM | POA: Diagnosis not present

## 2017-04-19 NOTE — Progress Notes (Signed)
Daily Session Note  Patient Details  Name: Jordan Wong MRN: [id number] Date of Birth: 14-May-1951 Referring Provider:     Cardiac Rehab from 03/29/2017 in [facility] Cardiac and Pulmonary Rehab  Referring Provider  Gollan      Encounter Date: 04/19/2017  Check In:     Session Check In - 04/19/17 1638      Check-In   Location [facility]-Cardiac & Pulmonary Rehab   Staff Present Gerlene Burdock, RN, Vickki Hearing, BA, ACSM CEP, Exercise Physiologist;Kelly Amedeo Plenty, BS, ACSM CEP, Exercise Physiologist;Delmar Dondero Flavia Shipper   Supervising physician immediately available to respond to emergencies See telemetry face sheet for immediately available ER MD   Medication changes reported     No   Fall or balance concerns reported    No   Warm-up and Cool-down Performed on first and last piece of equipment   Resistance Training Performed Yes   VAD Patient? No     Pain Assessment   Currently in Pain? No/denies   Multiple Pain Sites No         History  Smoking Status  . Never Smoker  Smokeless Tobacco  . Never Used    Goals Met:  Independence with exercise equipment Exercise tolerated well No report of cardiac concerns or symptoms Strength training completed today  Goals Unmet:  Not Applicable  Comments: Pt able to follow exercise prescription today without complaint.  Will continue to monitor for progression. Home Exercise reviewed with patient.   Dr. Emily Filbert is Medical Director for [place] and [facility].

## 2017-04-23 ENCOUNTER — Encounter: Payer: Medicare HMO | Admitting: *Deleted

## 2017-04-23 DIAGNOSIS — Z951 Presence of aortocoronary bypass graft: Secondary | ICD-10-CM | POA: Diagnosis not present

## 2017-04-23 NOTE — Progress Notes (Signed)
Daily Session Note  Patient Details  Name: Jordan Wong MRN: [id number] Date of Birth: 07-Apr-1951 Referring Provider:     Cardiac Rehab from 03/29/2017 in [facility] Cardiac and Pulmonary Rehab  Referring Provider  Gollan      Encounter Date: 04/23/2017  Check In:     Session Check In - 04/23/17 1724      Check-In   Location [facility]-Cardiac & Pulmonary Rehab   Staff Present Nyoka Cowden, RN, BSN, [place], BS, ACSM CEP, Exercise Physiologist;Other  Carson Myrtle, RRT         History  Smoking Status  . Never Smoker  Smokeless Tobacco  . Never Used    Goals Met:  Proper associated with RPD/PD & O2 Sat Exercise tolerated well No report of cardiac concerns or symptoms Strength training completed today  Goals Unmet:  Not Applicable  Comments:     Dr. Emily Filbert is Medical Director for [place] and [facility].

## 2017-04-25 ENCOUNTER — Encounter: Payer: Medicare HMO | Admitting: *Deleted

## 2017-04-25 DIAGNOSIS — Z951 Presence of aortocoronary bypass graft: Secondary | ICD-10-CM | POA: Diagnosis not present

## 2017-04-25 NOTE — Progress Notes (Signed)
Daily Session Note  Patient Details  Name: Jordan Wong MRN: [id number] Date of Birth: [birth year] Referring Provider:     Cardiac Rehab from 03/29/2017 in [facility] Cardiac and Pulmonary Rehab  Referring Provider  Gollan      Encounter Date: 04/25/2017  Check In:     Session Check In - 04/25/17 1727      Check-In   Location [facility]-Cardiac & Pulmonary Rehab   Staff Present Nyoka Cowden, RN, BSN, MA;Susanne Bice, RN, BSN, CCRP;Chaselyn Nanney Sherryll Burger, RN BSN   Supervising physician immediately available to respond to emergencies See telemetry face sheet for immediately available ER MD   Medication changes reported     No   Fall or balance concerns reported    No   Warm-up and Cool-down Performed on first and last piece of equipment   Resistance Training Performed Yes   VAD Patient? No     Pain Assessment   Currently in Pain? No/denies         History  Smoking Status  . Never Smoker  Smokeless Tobacco  . Never Used    Goals Met:  Proper associated with RPD/PD & O2 Sat Independence with exercise equipment Exercise tolerated well No report of cardiac concerns or symptoms Strength training completed today  Goals Unmet:  Not Applicable  Comments: Pt able to follow exercise prescription today without complaint.  Will continue to monitor for progression.    Dr. Emily Filbert is Medical Director for [place] and [facility].

## 2017-04-26 ENCOUNTER — Encounter: Payer: Medicare HMO | Admitting: *Deleted

## 2017-04-26 DIAGNOSIS — Z951 Presence of aortocoronary bypass graft: Secondary | ICD-10-CM

## 2017-04-26 NOTE — Progress Notes (Signed)
Daily Session Note  Patient Details  Name: Jordan Wong MRN: [id number] Date of Birth: 01-May-1951 Referring Provider:     Cardiac Rehab from 03/29/2017 in [facility] Cardiac and Pulmonary Rehab  Referring Provider  Gollan      Encounter Date: 04/26/2017  Check In:     Session Check In - 04/26/17 1718      Check-In   Location [facility]-Cardiac & Pulmonary Rehab   Staff Present Heath Lark, RN, BSN, CCRP;Carroll Enterkin, RN, BSN;Joseph [employer] physician immediately available to respond to emergencies See telemetry face sheet for immediately available ER MD   Medication changes reported     No   Fall or balance concerns reported    No   Warm-up and Cool-down Performed on first and last piece of equipment   Resistance Training Performed Yes   VAD Patient? No     Pain Assessment   Currently in Pain? No/denies         History  Smoking Status  . Never Smoker  Smokeless Tobacco  . Never Used    Goals Met:  Exercise tolerated well Personal goals reviewed No report of cardiac concerns or symptoms Strength training completed today  Goals Unmet:  Not Applicable  Comments: Doing well with exercise prescription progression.    Dr. Emily Filbert is Medical Director for [place] and [facility].

## 2017-04-30 DIAGNOSIS — Z951 Presence of aortocoronary bypass graft: Secondary | ICD-10-CM

## 2017-04-30 NOTE — Progress Notes (Signed)
Daily Session Note  Patient Details  Name: Jordan Wong MRN: [id number] Date of Birth: 1951-07-15 Referring Provider:     Cardiac Rehab from 03/29/2017 in [facility] Cardiac and Pulmonary Rehab  Referring Provider  Gollan      Encounter Date: 04/30/2017  Check In:     Session Check In - 04/30/17 1739      Check-In   Location [facility]-Cardiac & Pulmonary Rehab   Staff Present Nada Maclachlan, BA, ACSM CEP, Exercise Physiologist;Kelly Amedeo Plenty, BS, ACSM CEP, Exercise Physiologist;Meredith Sherryll Burger, RN BSN   Supervising physician immediately available to respond to emergencies See telemetry face sheet for immediately available ER MD   Medication changes reported     No   Fall or balance concerns reported    No   Warm-up and Cool-down Performed on first and last piece of equipment   Resistance Training Performed Yes   VAD Patient? No     Pain Assessment   Currently in Pain? No/denies         History  Smoking Status  . Never Smoker  Smokeless Tobacco  . Never Used    Goals Met:  Independence with exercise equipment Exercise tolerated well No report of cardiac concerns or symptoms Strength training completed today  Goals Unmet:  Not Applicable  Comments: Pt able to follow exercise prescription today without complaint.  Will continue to monitor for progression.    Dr. Emily Filbert is Medical Director for [place] and [facility].

## 2017-05-02 ENCOUNTER — Encounter: Payer: Self-pay | Admitting: *Deleted

## 2017-05-02 DIAGNOSIS — Z951 Presence of aortocoronary bypass graft: Secondary | ICD-10-CM | POA: Diagnosis not present

## 2017-05-02 NOTE — Progress Notes (Signed)
Daily Session Note  Patient Details  Name: Jordan Wong MRN: [id number] Date of Birth: 1951/05/13 Referring Provider:     Cardiac Rehab from 03/29/2017 in [facility] Cardiac and Pulmonary Rehab  Referring Provider  Gollan      Encounter Date: 05/02/2017  Check In:     Session Check In - 05/02/17 1617      Check-In   Location [facility]-Cardiac & Pulmonary Rehab   Staff Present Gerlene Burdock, RN, Levie Heritage, MA, ACSM RCEP, Exercise Physiologist;Amanda Oletta Darter, BA, ACSM CEP, Exercise Physiologist;Joseph Alcus Dad, RN BSN   Supervising physician immediately available to respond to emergencies See telemetry face sheet for immediately available ER MD   Medication changes reported     No   Fall or balance concerns reported    No   Warm-up and Cool-down Performed on first and last piece of equipment   Resistance Training Performed Yes   VAD Patient? No     Pain Assessment   Currently in Pain? No/denies   Multiple Pain Sites No         History  Smoking Status  . Never Smoker  Smokeless Tobacco  . Never Used    Goals Met:  Proper associated with RPD/PD & O2 Sat Exercise tolerated well No report of cardiac concerns or symptoms Strength training completed today  Goals Unmet:  Not Applicable  Comments:     Dr. Emily Filbert is Medical Director for [place] and [facility].

## 2017-05-02 NOTE — Progress Notes (Signed)
Cardiac Individual Treatment Plan  Patient Details  Name: Jordan Wong MRN: [id number] Date of Birth: [birth year] Referring Provider:     Cardiac Rehab from 03/29/2017 in [facility] Cardiac and Pulmonary Rehab  Referring Provider  Gollan      Initial Encounter Date:    Cardiac Rehab from 03/29/2017 in [facility] Cardiac and Pulmonary Rehab  Date  03/29/17  Referring Provider  Rockey Situ      Visit Diagnosis: S/P CABG x 5  Patient's Home Medications on Admission:  Current Outpatient Prescriptions:  .  acetaminophen (TYLENOL) 325 MG tablet, Take 2 tablets (650 mg total) by mouth every 6 (six) hours as needed for mild pain (or Fever >/= 101)., Disp: , Rfl:  .  aspirin EC 325 MG EC tablet, Take 1 tablet (325 mg total) by mouth daily., Disp: 30 tablet, Rfl: 0 .  atorvastatin (LIPITOR) 40 MG tablet, Take 1 tablet (40 mg total) by mouth daily at 6 PM., Disp: 30 tablet, Rfl: 2 .  colchicine 0.6 MG tablet, Take 0.6 mg by mouth daily as needed. , Disp: , Rfl: 0 .  Melatonin 3 MG TABS, Take by mouth., Disp: , Rfl:  .  metoprolol tartrate (LOPRESSOR) 25 MG tablet, Take 0.5 tablets (12.5 mg total) by mouth 2 (two) times daily., Disp: 60 tablet, Rfl: 1  Past Medical History: Past Medical History:  Diagnosis Date  . CAD (coronary artery disease)    a. 02/2017 MV: septal, apical, dist antsept ischemia, EF 50%, 73m ST dep w/ exercise; b. 02/2017 Cath: LM nl, LAD 99p, 50d, D1 90ost, RI 95ost, LCX 767m, RCA 95p, EF 55-65%; c. 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  . Marland Kitchenepatitis C 2016   a. with hepatic fibrosis - followed at [facility]; b. s/p therapy - Viekira Pak (ombitasvir, paritaprevir and ritonavir 12.5/75/5086mlus dasabuvir 250m57m ribavirin x 12 wks as part of Prioritize study.  . History of blood transfusion 1987   "related to leg OR"  . History of echocardiogram    a. 02/2017 Echo: EF 55-60%, no rwma, mild MR, mildly dil LA.  . HiMarland Kitchentory of gout   . HLD (hyperlipidemia)   .  Hyperglycemia   . Pneumonia 2000s X 1    Tobacco Use: History  Smoking Status  . Never Smoker  Smokeless Tobacco  . Never Used    Labs: Recent Review Flowsheet Data    Labs for ITP Cardiac and Pulmonary Rehab Latest Ref Rng & Units 02/23/2017 02/23/2017 02/23/2017 02/23/2017 02/24/2017   Cholestrol 0 - 200 mg/dL - - - - -   LDLCALC 0 - 99 mg/dL - - - - -   HDL >40 mg/dL - - - - -   Trlycerides <150 mg/dL - - - - -   Hemoglobin A1c 4.8 - 5.6 % - - - - -   PHART 7.350 - 7.450 7.355 7.407 7.346(L) - -   PCO2ART 32.0 - 48.0 mmHg 42.7 41.6 41.9 - -   HCO3 20.0 - 28.0 mmol/L 24.0 26.3 22.9 - -   TCO2 0 - 100 mmol/L _0 ACIDBASEDEF 0.0 - 2.0 mmol/L 2.0 - 3.0(H) - -   O2SAT % 97.0 97.0 96.0 - -       Exercise Target Goals:    Exercise Program Goal: Individual exercise prescription set with THRR, safety & activity barriers. Participant demonstrates ability to understand and report RPE using BORG scale, to self-measure pulse accurately, and to acknowledge the importance  of the exercise prescription.  Exercise Prescription Goal: Starting with aerobic activity 30 plus minutes a day, 3 days per week for initial exercise prescription. Provide home exercise prescription and guidelines that participant acknowledges understanding prior to discharge.  Activity Barriers & Risk Stratification:     Activity Barriers & Cardiac Risk Stratification - 03/29/17 1442      Activity Barriers & Cardiac Risk Stratification   Activity Barriers None   Cardiac Risk Stratification High      6 Minute Walk:     6 Minute Walk    Row Name 03/29/17 1441         6 Minute Walk   Phase Initial     Distance 1320 feet     Walk Time 6 minutes     # of Rest Breaks 0     MPH 2.5     METS 3.35     RPE 13     VO2 Peak 11.7     Symptoms No     Resting HR 67 bpm     Resting BP 106/68     Max Ex. HR 100 bpm     Max Ex. BP 130/70     2 Minute Post BP 108/60        Oxygen Initial  Assessment:   Oxygen Re-Evaluation:   Oxygen Discharge (Final Oxygen Re-Evaluation):   Initial Exercise Prescription:     Initial Exercise Prescription - 03/29/17 1400      Date of Initial Exercise RX and Referring Provider   Date 03/29/17   Referring Provider Gollan     Treadmill   MPH 2.5   Grade 1   Minutes 15   METs 3.35     Recumbant Bike   Level 3   RPM 60   Watts 34   Minutes 15   METs 3.35     T5 Nustep   Level 3   SPM 80   Minutes 15   METs 3      Perform Capillary Blood Glucose checks as needed.  Exercise Prescription Changes:     Exercise Prescription Changes    Row Name 03/29/17 1400 04/11/17 1200 04/19/17 1600 04/24/17 1400       Response to Exercise   Blood Pressure (Admit) 106/68 124/82  - 126/65    Blood Pressure (Exercise) 130/70 134/66  - 130/74    Blood Pressure (Exit) 108/60 110/70  - 122/60    Heart Rate (Admit) 69 bpm 66 bpm  - 74 bpm    Heart Rate (Exercise) 103 bpm 113 bpm  - 118 bpm    Heart Rate (Exit) 68 bpm 73 bpm  - 82 bpm    Rating of Perceived Exertion (Exercise) 13 13  - 13    Symptoms  - none  - none    Duration  - Continue with 45 min of aerobic exercise without signs/symptoms of physical distress.  - Continue with 45 min of aerobic exercise without signs/symptoms of physical distress.    Intensity  - THRR unchanged  - THRR unchanged      Progression   Progression  - Continue to progress workloads to maintain intensity without signs/symptoms of physical distress.  - Continue to progress workloads to maintain intensity without signs/symptoms of physical distress.    Average METs  - 3.35  - 4.4      Resistance Training   Weight  - 3  - 3 lbs    Reps  - 10-15  -  10-15      Interval Training   Interval Training  - No  - No      Treadmill   MPH  -  -  - 2.5    Grade  -  -  - 1    Minutes  -  -  - 15    METs  -  -  - 3.35      Recumbant Bike   Level  - 3  - 3    Watts  - 34  - 34    Minutes  - 15  - 15     METs  - 3.35  - 3.35      T5 Nustep   Level  - 6  - 3    Minutes  - 15  - 15    METs  -  -  - 6.6      Home Exercise Plan   Plans to continue exercise at  -  - Home (comment)  walk,  considering FF Home (comment)  walk,  considering FF    Frequency  -  - Add 1 additional day to program exercise sessions. Add 1 additional day to program exercise sessions.    Initial Home Exercises Provided  -  - 04/19/17 04/19/17       Exercise Comments:     Exercise Comments    Row Name 04/19/17 1640           Exercise Comments Home Exercise reviewed with patient.          Exercise Goals and Review:     Exercise Goals    Row Name 03/29/17 1441             Exercise Goals   Increase Physical Activity Yes       Intervention Provide advice, education, support and counseling about physical activity/exercise needs.;Develop an individualized exercise prescription for aerobic and resistive training based on initial evaluation findings, risk stratification, comorbidities and participant's personal goals.       Expected Outcomes Achievement of increased cardiorespiratory fitness and enhanced flexibility, muscular endurance and strength shown through measurements of functional capacity and personal statement of participant.       Increase Strength and Stamina Yes       Intervention Develop an individualized exercise prescription for aerobic and resistive training based on initial evaluation findings, risk stratification, comorbidities and participant's personal goals.;Provide advice, education, support and counseling about physical activity/exercise needs.       Expected Outcomes Achievement of increased cardiorespiratory fitness and enhanced flexibility, muscular endurance and strength shown through measurements of functional capacity and personal statement of participant.          Exercise Goals Re-Evaluation :     Exercise Goals Re-Evaluation    Row Name 04/11/17 1247 04/19/17 1650  04/24/17 1350         Exercise Goal Re-Evaluation   Exercise Goals Review Increase Physical Activity;Increase Strenth and Stamina Increase Physical Activity Increase Physical Activity;Increase Strenth and Stamina     Comments Laurey Arrow has had a successful first week of exercise and has tolerated exercise well. Home exercise reviewed. Pete plans to exercise at home by walking.  Laurey Arrow has been doing well in rehab.  He is up to 6.6 METs on the NuStep and 50 watts on the bike.  We will continue to monitor his progress.     Expected Outcomes Short - Laurey Arrow will attend HT regularly.  Long - Laurey Arrow will establish consisten exercise  habits. Short: Laurey Arrow will add one day of exercise. Long: Laurey Arrow will exercise on his own outside of class. Short: Continue to work on improving workloads.  Long: Continue to exercise some independently.        Discharge Exercise Prescription (Final Exercise Prescription Changes):     Exercise Prescription Changes - 04/24/17 1400      Response to Exercise   Blood Pressure (Admit) 126/65   Blood Pressure (Exercise) 130/74   Blood Pressure (Exit) 122/60   Heart Rate (Admit) 74 bpm   Heart Rate (Exercise) 118 bpm   Heart Rate (Exit) 82 bpm   Rating of Perceived Exertion (Exercise) 13   Symptoms none   Duration Continue with 45 min of aerobic exercise without signs/symptoms of physical distress.   Intensity THRR unchanged     Progression   Progression Continue to progress workloads to maintain intensity without signs/symptoms of physical distress.   Average METs 4.4     Resistance Training   Weight 3 lbs   Reps 10-15     Interval Training   Interval Training No     Treadmill   MPH 2.5   Grade 1   Minutes 15   METs 3.35     Recumbant Bike   Level 3   Watts 34   Minutes 15   METs 3.35     T5 Nustep   Level 3   Minutes 15   METs 6.6     Home Exercise Plan   Plans to continue exercise at Home (comment)  walk,  considering FF   Frequency Add 1 additional day  to program exercise sessions.   Initial Home Exercises Provided 04/19/17      Nutrition:  Target Goals: Understanding of nutrition guidelines, daily intake of sodium '1500mg'$ , cholesterol '200mg'$ , calories 30% from fat and 7% or less from saturated fats, daily to have 5 or more servings of fruits and vegetables.  Biometrics:     Pre Biometrics - 03/29/17 1440      Pre Biometrics   Height '5\' 9"'$  (1.753 m)   Weight 173 lb 8 oz (78.7 kg)   Waist Circumference 39.75 inches   Hip Circumference 39.5 inches   Waist to Hip Ratio 1.01 %   BMI (Calculated) 25.7   Single Leg Stand 16.57 seconds       Nutrition Therapy Plan and Nutrition Goals:   Nutrition Discharge: Rate Your Plate Scores:     Nutrition Assessments - 03/29/17 1439      MEDFICTS Scores   Pre Score 15      Nutrition Goals Re-Evaluation:     Nutrition Goals Re-Evaluation    Row Name 04/26/17 1652             Goals   Comment rd appoinment deferred.  Has made healthy changes and is doing well with the changes.       Expected Outcome Will let staff know if he changes his mind to meet with the RD          Nutrition Goals Discharge (Final Nutrition Goals Re-Evaluation):     Nutrition Goals Re-Evaluation - 04/26/17 1652      Goals   Comment rd appoinment deferred.  Has made healthy changes and is doing well with the changes.   Expected Outcome Will let staff know if he changes his mind to meet with the RD      Psychosocial: Target Goals: Acknowledge presence or absence of significant depression and/or stress, maximize coping skills,  provide positive support system. Participant is able to verbalize types and ability to use techniques and skills needed for reducing stress and depression.   Initial Review & Psychosocial Screening:     Initial Psych Review & Screening - 03/29/17 1439      Initial Review   Current issues with None Identified     Family Dynamics   Good Support System? Yes  Wife      Barriers   Psychosocial barriers to participate in program There are no identifiable barriers or psychosocial needs.     Screening Interventions   Interventions Encouraged to exercise;Program counselor consult      Quality of Life Scores:      Quality of Life - 03/29/17 1439      Quality of Life Scores   Health/Function Pre 20.83 %   Socioeconomic Pre 20.71 %   Psych/Spiritual Pre 21 %   Family Pre 21 %   GLOBAL Pre 20.87 %      PHQ-9: Recent Review Flowsheet Data    Depression screen [employer] 2/9 03/29/2017   Decreased Interest 0   Down, Depressed, Hopeless 0   PHQ - 2 Score 0   Altered sleeping 1   Tired, decreased energy 1   Change in appetite 0   Feeling bad or failure about yourself  0   Trouble concentrating 0   Moving slowly or fidgety/restless 0   Suicidal thoughts 0   PHQ-9 Score 2   Difficult doing work/chores Not difficult at all     Interpretation of Total Score  Total Score Depression Severity:  1-4 = Minimal depression, 5-9 = Mild depression, 10-14 = Moderate depression, 15-19 = Moderately severe depression, 20-27 = Severe depression   Psychosocial Evaluation and Intervention:     Psychosocial Evaluation - 04/04/17 1717      Psychosocial Evaluation & Interventions   Interventions Encouraged to exercise with the program and follow exercise prescription;Stress management education;Relaxation education   Comments Counselor met with Mr. Charters Collier Salina) today for initial psychosocial evaluation.  He is a 66 year old who had open heart surgery recently with a CABGx5.  Collier Salina has a strong support system with a spouse of 13 years and several adult daughters locally; as well as active involvement in his local church.  Collier Salina reports having some chronic intermittent sleep problems over the past (3) years and takes an OTC sleep aid to help.  He reports recently this has improved.  He has a good appetite as well.  Collier Salina denies a history of depression or anxiety and reports  he is typically in a positive mood.  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  He has goals to increase his stamina and strength and possibly lose some weight while in this program.  Counselor encouraged Collier Salina to exercise consistently for his sleep issues as well as his stress issues to assist with coping.  Staff will follow with Collier Salina throughout the course of this program.    Expected Outcomes Collier Salina will benefit from consistent exercise to achieve his stated goals.  He also will benefit from participating in the educational components of this program and psychoeducational to learn more about coping strategies and relaxation to help with his stress management.  Collier Salina will meet with the dietician to address his weight loss goals.     Continue Psychosocial Services  Follow up required by staff      Psychosocial Re-Evaluation:  Psychosocial Re-Evaluation    Row Name 04/26/17 1634             Psychosocial Re-Evaluation   Current issues with None Identified       Comments Laurey Arrow has had no changes regarding stress since he talked with Juliann Pulse.  He was reminded to talk to the staff if any concerns happen.       Expected Outcomes Short and long term: Continue without stress and be ware to report any concerns       Continue Psychosocial Services  Follow up required by staff          Psychosocial Discharge (Final Psychosocial Re-Evaluation):     Psychosocial Re-Evaluation - 04/26/17 1634      Psychosocial Re-Evaluation   Current issues with None Identified   Comments Laurey Arrow has had no changes regarding stress since he talked with Juliann Pulse.  He was reminded to talk to the staff if any concerns happen.   Expected Outcomes Short and long term: Continue without stress and be ware to report any concerns   Continue Psychosocial Services  Follow up required by staff      Vocational Rehabilitation: Provide vocational rehab  assistance to qualifying candidates.   Vocational Rehab Evaluation & Intervention:     Vocational Rehab - 03/29/17 1440      Initial Vocational Rehab Evaluation & Intervention   Assessment shows need for Vocational Rehabilitation No      Education: Education Goals: Education classes will be provided on a weekly basis, covering required topics. Participant will state understanding/return demonstration of topics presented.  Learning Barriers/Preferences:     Learning Barriers/Preferences - 03/29/17 1440      Learning Barriers/Preferences   Learning Barriers None   Learning Preferences Individual Instruction      Education Topics: General Nutrition Guidelines/Fats and Fiber: -Group instruction provided by verbal, written material, models and posters to present the general guidelines for heart healthy nutrition. Gives an explanation and review of dietary fats and fiber.   Controlling Sodium/Reading Food Labels: -Group verbal and written material supporting the discussion of sodium use in heart healthy nutrition. Review and explanation with models, verbal and written materials for utilization of the food label.   Exercise Physiology & Risk Factors: - Group verbal and written instruction with models to review the exercise physiology of the cardiovascular system and associated critical values. Details cardiovascular disease risk factors and the goals associated with each risk factor.   Aerobic Exercise & Resistance Training: - Gives group verbal and written discussion on the health impact of inactivity. On the components of aerobic and resistive training programs and the benefits of this training and how to safely progress through these programs.   Flexibility, Balance, General Exercise Guidelines: - Provides group verbal and written instruction on the benefits of flexibility and balance training programs. Provides general exercise guidelines with specific guidelines to those  with heart or lung disease. Demonstration and skill practice provided.   Stress Management: - Provides group verbal and written instruction about the health risks of elevated stress, cause of high stress, and healthy ways to reduce stress.   Cardiac Rehab from 04/30/2017 in [facility] Cardiac and Pulmonary Rehab  Date  04/11/17  Educator  [facility]  Instruction Review Code  2- meets goals/outcomes      Depression: - Provides group verbal and written instruction on the correlation between heart/lung disease and depressed mood, treatment options, and the stigmas associated with seeking treatment.   Anatomy & Physiology of the  Heart: - Group verbal and written instruction and models provide basic cardiac anatomy and physiology, with the coronary electrical and arterial systems. Review of: AMI, Angina, Valve disease, Heart Failure, Cardiac Arrhythmia, Pacemakers, and the ICD.   Cardiac Rehab from 04/30/2017 in [facility] Cardiac and Pulmonary Rehab  Date  04/09/17  Educator  [facility]  Instruction Review Code  2- meets goals/outcomes      Cardiac Procedures: - Group verbal and written instruction and models to describe the testing methods done to diagnose heart disease. Reviews the outcomes of the test results. Describes the treatment choices: Medical Management, Angioplasty, or Coronary Bypass Surgery.   Cardiac Rehab from 04/30/2017 in [facility] Cardiac and Pulmonary Rehab  Date  04/16/17  Educator  [facility]  Instruction Review Code  2- meets goals/outcomes      Cardiac Medications: - Group verbal and written instruction to review commonly prescribed medications for heart disease. Reviews the medication, class of the drug, and side effects. Includes the steps to properly store meds and maintain the prescription regimen.   Cardiac Rehab from 04/30/2017 in [facility] Cardiac and Pulmonary Rehab  Date  04/23/17 [Part 2 04/25/17]  Educator  MJA , RN [part 2 SB]  Instruction Review Code  2- meets goals/outcomes      Go  Sex-Intimacy & Heart Disease, Get SMART - Goal Setting: - Group verbal and written instruction through game format to discuss heart disease and the return to sexual intimacy. Provides group verbal and written material to discuss and apply goal setting through the application of the S.M.A.R.T. Method.   Cardiac Rehab from 04/30/2017 in [facility] Cardiac and Pulmonary Rehab  Date  04/16/17  Educator  [facility]  Instruction Review Code  2- meets goals/outcomes      Other Matters of the Heart: - Provides group verbal, written materials and models to describe Heart Failure, Angina, Valve Disease, and Diabetes in the realm of heart disease. Includes description of the disease process and treatment options available to the cardiac patient.   Cardiac Rehab from 04/30/2017 in [facility] Cardiac and Pulmonary Rehab  Date  04/09/17  Educator  [facility]  Instruction Review Code  2- meets goals/outcomes      Exercise & Equipment Safety: - Individual verbal instruction and demonstration of equipment use and safety with use of the equipment.   Cardiac Rehab from 04/30/2017 in [facility] Cardiac and Pulmonary Rehab  Date  03/29/17  Educator  [facility]  Instruction Review Code  2- meets goals/outcomes      Infection Prevention: - Provides verbal and written material to individual with discussion of infection control including proper hand washing and proper equipment cleaning during exercise session.   Cardiac Rehab from 04/30/2017 in [facility] Cardiac and Pulmonary Rehab  Date  03/29/17  Educator  [facility]  Instruction Review Code  2- meets goals/outcomes      Falls Prevention: - Provides verbal and written material to individual with discussion of falls prevention and safety.   Cardiac Rehab from 04/30/2017 in [facility] Cardiac and Pulmonary Rehab  Date  03/29/17  Educator  [facility]  Instruction Review Code  2- meets goals/outcomes      Diabetes: - Individual verbal and written instruction to review signs/symptoms of diabetes, desired ranges of  glucose level fasting, after meals and with exercise. Advice that pre and post exercise glucose checks will be done for 3 sessions at entry of program.   Cardiac Rehab from 04/30/2017 in [facility] Cardiac and Pulmonary Rehab  Date  03/29/17  Educator  [facility]  Instruction Review Code  2- meets goals/outcomes       Knowledge Questionnaire Score:     Knowledge Questionnaire Score - 03/29/17 1440      Knowledge Questionnaire Score   Pre Score 21/28  Correct answers reviewed with "Pete"       Core Components/Risk Factors/Patient Goals at Admission:     Personal Goals and Risk Factors at Admission - 03/29/17 1438      Core Components/Risk Factors/Patient Goals on Admission   Lipids Yes   Intervention Provide education and support for participant on nutrition & aerobic/resistive exercise along with prescribed medications to achieve LDL '70mg'$ , HDL >'40mg'$ .   Expected Outcomes Short Term: Participant states understanding of desired cholesterol values and is compliant with medications prescribed. Participant is following exercise prescription and nutrition guidelines.;Long Term: Cholesterol controlled with medications as prescribed, with individualized exercise RX and with personalized nutrition plan. Value goals: LDL < '70mg'$ , HDL > 40 mg.   Stress Yes   Intervention Offer individual and/or small group education and counseling on adjustment to heart disease, stress management and health-related lifestyle change. Teach and support self-help strategies.;Refer participants experiencing significant psychosocial distress to appropriate mental health specialists for further evaluation and treatment. When possible, include family members and significant others in education/counseling sessions.   Expected Outcomes Short Term: Participant demonstrates changes in health-related behavior, relaxation and other stress management skills, ability to obtain effective social support, and compliance with psychotropic  medications if prescribed.;Long Term: Emotional wellbeing is indicated by absence of clinically significant psychosocial distress or social isolation.      Core Components/Risk Factors/Patient Goals Review:      Goals and Risk Factor Review    Row Name 04/26/17 1647             Core Components/Risk Factors/Patient Goals Review   Personal Goals Review Lipids       Review Spoke with Laurey Arrow about his cholesterol, he verbalized understanding about keeping his risk factor under control by being compliant with his meds, good nutrition and exercise.         Expected Outcomes Short and long term   Adherence with meds, nutrition and exercise for great risk factor control          Core Components/Risk Factors/Patient Goals at Discharge (Final Review):      Goals and Risk Factor Review - 04/26/17 1647      Core Components/Risk Factors/Patient Goals Review   Personal Goals Review Lipids   Review Spoke with Laurey Arrow about his cholesterol, he verbalized understanding about keeping his risk factor under control by being compliant with his meds, good nutrition and exercise.     Expected Outcomes Short and long term   Adherence with meds, nutrition and exercise for great risk factor control      ITP Comments:     ITP Comments    Row Name 03/29/17 1436 04/04/17 0716 05/02/17 0640       ITP Comments Med review completed. Initial ITP created. Diagnosis can be found in [facility] 6/13 30 day review. Continue with ITP unless directed changes per Medical Director review    New to Program 30 day review. Continue with ITP unless directed changes per Medical Director review         Comments:

## 2017-05-02 NOTE — Progress Notes (Signed)
Daily Session Note  Patient Details  Name: Jordan Wong MRN: [id number] Date of Birth: 01/03/1951 Referring Provider:     Cardiac Rehab from 03/29/2017 in [facility] Cardiac and Pulmonary Rehab  Referring Provider  Gollan      Encounter Date: 05/02/2017  Check In:     Session Check In - 05/02/17 1617      Check-In   Location [facility]-Cardiac & Pulmonary Rehab   Staff Present Carroll Enterkin, RN, BSN;Jessica Hawkins, MA, ACSM RCEP, Exercise Physiologist;Amanda Sommer, BA, ACSM CEP, Exercise Physiologist;Joseph Hood RCP,RRT,BSRT;Meredith Craven, RN BSN   Supervising physician immediately available to respond to emergencies See telemetry face sheet for immediately available ER MD   Medication changes reported     No   Fall or balance concerns reported    No   Warm-up and Cool-down Performed on first and last piece of equipment   Resistance Training Performed Yes   VAD Patient? No     Pain Assessment   Currently in Pain? No/denies   Multiple Pain Sites No         History  Smoking Status  . Never Smoker  Smokeless Tobacco  . Never Used    Goals Met:  Independence with exercise equipment Exercise tolerated well No report of cardiac concerns or symptoms Strength training completed today  Goals Unmet:  Not Applicable  Comments: Pt able to follow exercise prescription today without complaint.  Will continue to monitor for progression.   Dr. Mark Miller is Medical Director for [facility] and [facility]. 

## 2017-05-03 DIAGNOSIS — Z951 Presence of aortocoronary bypass graft: Secondary | ICD-10-CM

## 2017-05-03 NOTE — Progress Notes (Signed)
Daily Session Note  Patient Details  Name: Jordan Wong MRN: [id number] Date of Birth: [birth year] Referring Provider:     Cardiac Rehab from 03/29/2017 in [facility] Cardiac and Pulmonary Rehab  Referring Provider  Gollan      Encounter Date: 05/03/2017  Check In:     Session Check In - 05/03/17 1625      Check-In   Location [facility] Re[facility]   Staff Present Gerlene Burdock, RN, BSN;Selda Jalbert RCP,RRT,BSRT;Kelly Inwood, [place], ACSM CEP, Exercise Physiologist   Supervising physician immediately available to respond to emergencies See telemetry face sheet for immediately available ER MD   Medication changes reported     No   Fall or balance concerns reported    No   Warm-up and Cool-down Performed on first and last piece of equipment   Resistance Training Performed Yes   VAD Patient? No     Pain Assessment   Currently in Pain? No/denies         History  Smoking Status  . Never Smoker  Smokeless Tobacco  . Never Used    Goals Met:  Independence with exercise equipment Exercise tolerated well No report of cardiac concerns or symptoms Strength training completed today  Goals Unmet:  Not Applicable  Comments: Pt able to follow exercise prescription today without complaint.  Will continue to monitor for progression.   Dr. Emily Filbert is Medical Director for [place] and [facility].

## 2017-05-04 ENCOUNTER — Other Ambulatory Visit: Payer: Self-pay | Admitting: Surgery

## 2017-05-04 DIAGNOSIS — I2510 Atherosclerotic heart disease of native coronary artery without angina pectoris: Secondary | ICD-10-CM

## 2017-05-07 ENCOUNTER — Encounter: Payer: Medicare HMO | Admitting: *Deleted

## 2017-05-07 DIAGNOSIS — Z951 Presence of aortocoronary bypass graft: Secondary | ICD-10-CM

## 2017-05-07 NOTE — Progress Notes (Signed)
Daily Session Note  Patient Details  Name: Elbert Spickler MRN: [id number] Date of Birth: [birth year] Referring Provider:     Cardiac Rehab from 03/29/2017 in [facility] Cardiac and Pulmonary Rehab  Referring Provider  Gollan      Encounter Date: 05/07/2017  Check In:     Session Check In - 05/07/17 1636      Check-In   Location [facility] Re[facility]   Staff Present Nyoka Cowden, RN, BSN, MA;Kodee Drury Sherryll Burger, RN Moises Blood, BS, ACSM CEP, Exercise Physiologist   Supervising physician immediately available to respond to emergencies See telemetry face sheet for immediately available ER MD   Medication changes reported     No   Fall or balance concerns reported    No   Warm-up and Cool-down Performed on first and last piece of equipment   Resistance Training Performed Yes   VAD Patient? No     Pain Assessment   Currently in Pain? No/denies   Multiple Pain Sites No         History  Smoking Status  . Never Smoker  Smokeless Tobacco  . Never Used    Goals Met:  Proper associated with RPD/PD & O2 Sat Independence with exercise equipment Exercise tolerated well No report of cardiac concerns or symptoms Strength training completed today  Goals Unmet:  Not Applicable  Comments: Pt able to follow exercise prescription today without complaint.  Will continue to monitor for progression.    Dr. Emily Filbert is Medical Director for [place] and [facility].

## 2017-05-09 ENCOUNTER — Encounter: Payer: Medicare HMO | Admitting: *Deleted

## 2017-05-09 DIAGNOSIS — Z951 Presence of aortocoronary bypass graft: Secondary | ICD-10-CM

## 2017-05-09 NOTE — Progress Notes (Signed)
Daily Session Note  Patient Details  Name: Jordan Wong MRN: [id number] Date of Birth: [birth year] Referring Provider:     Cardiac Rehab from 03/29/2017 in [facility] Cardiac and Pulmonary Rehab  Referring Provider  Gollan      Encounter Date: 05/09/2017  Check In:     Session Check In - 05/09/17 1620      Check-In   Location [facility] Re[facility]   Staff Present Renita Papa, RN Vickki Hearing, BA, ACSM CEP, Exercise Physiologist;Carroll Enterkin, RN, BSN   Supervising physician immediately available to respond to emergencies See telemetry face sheet for immediately available ER MD   Medication changes reported     No   Fall or balance concerns reported    No   Warm-up and Cool-down Performed on first and last piece of equipment   Resistance Training Performed Yes   VAD Patient? No     Pain Assessment   Currently in Pain? No/denies         History  Smoking Status  . Never Smoker  Smokeless Tobacco  . Never Used    Goals Met:  Proper associated with RPD/PD & O2 Sat Independence with exercise equipment Exercise tolerated well No report of cardiac concerns or symptoms Strength training completed today  Goals Unmet:  Not Applicable  Comments: Pt able to follow exercise prescription today without complaint.  Will continue to monitor for progression.    Dr. Emily Filbert is Medical Director for [place] and [facility].

## 2017-05-10 DIAGNOSIS — Z951 Presence of aortocoronary bypass graft: Secondary | ICD-10-CM | POA: Diagnosis not present

## 2017-05-10 NOTE — Progress Notes (Signed)
Daily Session Note  Patient Details  Name: Jordan Wong MRN: [id number] Date of Birth: 05/28/1951 Referring Provider:     Cardiac Rehab from 03/29/2017 in [facility] Cardiac and Pulmonary Rehab  Referring Provider  Gollan      Encounter Date: 05/10/2017  Check In:     Session Check In - 05/10/17 1623      Check-In   Location [facility]   Staff Present Gerlene Burdock, RN, Moises Blood, BS, ACSM CEP, Exercise Physiologist;Ojas Coone Flavia Shipper   Supervising physician immediately available to respond to emergencies See telemetry face sheet for immediately available ER MD   Medication changes reported     No   Fall or balance concerns reported    No   Warm-up and Cool-down Performed on first and last piece of equipment   Resistance Training Performed Yes   VAD Patient? No     Pain Assessment   Currently in Pain? No/denies   Multiple Pain Sites No         History  Smoking Status  . Never Smoker  Smokeless Tobacco  . Never Used    Goals Met:  Independence with exercise equipment Exercise tolerated well No report of cardiac concerns or symptoms Strength training completed today  Goals Unmet:  Not Applicable  Comments: Pt able to follow exercise prescription today without complaint.  Will continue to monitor for progression.   Dr. Emily Filbert is Medical Director for [place] and [facility].

## 2017-05-14 DIAGNOSIS — Z951 Presence of aortocoronary bypass graft: Secondary | ICD-10-CM | POA: Diagnosis not present

## 2017-05-14 NOTE — Progress Notes (Signed)
Daily Session Note  Patient Details  Name: Jordan Wong MRN: [id number] Date of Birth: 1951/01/16 Referring Provider:     Cardiac Rehab from 03/29/2017 in [facility] Cardiac and Pulmonary Rehab  Referring Provider  Gollan      Encounter Date: 05/14/2017  Check In:     Session Check In - 05/14/17 1638      Check-In   Location [facility]   Staff Present Nyoka Cowden, RN, BSN, Kela Millin, BA, ACSM CEP, Exercise Physiologist;Kelly Amedeo Plenty, BS, ACSM CEP, Exercise Physiologist   Supervising physician immediately available to respond to emergencies See telemetry face sheet for immediately available ER MD   Medication changes reported     No   Fall or balance concerns reported    No   Warm-up and Cool-down Performed on first and last piece of equipment   Resistance Training Performed Yes   VAD Patient? No     Pain Assessment   Currently in Pain? No/denies         History  Smoking Status  . Never Smoker  Smokeless Tobacco  . Never Used    Goals Met:  Independence with exercise equipment Exercise tolerated well No report of cardiac concerns or symptoms Strength training completed today  Goals Unmet:  Not Applicable  Comments:  Pt able to follow exercise prescription today without complaint.  Will continue to monitor for progression.    Dr. Emily Filbert is Medical Director for [place] and [facility].

## 2017-05-16 ENCOUNTER — Encounter: Payer: Medicare HMO | Admitting: *Deleted

## 2017-05-16 DIAGNOSIS — Z951 Presence of aortocoronary bypass graft: Secondary | ICD-10-CM

## 2017-05-16 NOTE — Progress Notes (Signed)
Daily Session Note  Patient Details  Name: Jordan Wong MRN: [id number] Date of Birth: 10-Dec-1950 Referring Provider:     Cardiac Rehab from 03/29/2017 in [facility] Cardiac and Pulmonary Rehab  Referring Provider  Gollan      Encounter Date: 05/16/2017  Check In:     Session Check In - 05/16/17 1642      Check-In   Location [facility]   Staff Present Nyoka Cowden, RN, BSN, MA;Margery Szostak, RN, Vickki Hearing, BA, ACSM CEP, Exercise Physiologist   Supervising physician immediately available to respond to emergencies See telemetry face sheet for immediately available ER MD   Medication changes reported     No   Fall or balance concerns reported    No   Tobacco Cessation No Change   Warm-up and Cool-down Performed on first and last piece of equipment   Resistance Training Performed Yes   VAD Patient? No     Pain Assessment   Currently in Pain? No/denies         History  Smoking Status  . Never Smoker  Smokeless Tobacco  . Never Used    Goals Met:  Proper associated with RPD/PD & O2 Sat Exercise tolerated well No report of cardiac concerns or symptoms Strength training completed today  Goals Unmet:  Not Applicable  Comments:     Dr. Emily Filbert is Medical Director for [place] and [facility].

## 2017-05-17 DIAGNOSIS — Z951 Presence of aortocoronary bypass graft: Secondary | ICD-10-CM | POA: Diagnosis not present

## 2017-05-17 NOTE — Progress Notes (Signed)
Daily Session Note  Patient Details  Name: Jordan Wong MRN: [id number] Date of Birth: Dec 19, 1950 Referring Provider:     Cardiac Rehab from 03/29/2017 in [facility] Cardiac and Pulmonary Rehab  Referring Provider  Gollan      Encounter Date: 05/17/2017  Check In:     Session Check In - 05/17/17 1642      Check-In   Location [facility]   Staff Present Gerlene Burdock, RN, Moises Blood, BS, ACSM CEP, Exercise Physiologist;Shantaya Bluestone Flavia Shipper   Supervising physician immediately available to respond to emergencies See telemetry face sheet for immediately available ER MD   Medication changes reported     No   Fall or balance concerns reported    No   Warm-up and Cool-down Performed on first and last piece of equipment   Resistance Training Performed Yes   VAD Patient? No     Pain Assessment   Currently in Pain? No/denies   Multiple Pain Sites No         History  Smoking Status  . Never Smoker  Smokeless Tobacco  . Never Used    Goals Met:  Independence with exercise equipment Exercise tolerated well No report of cardiac concerns or symptoms Strength training completed today  Goals Unmet:  Not Applicable  Comments: Pt able to follow exercise prescription today without complaint.  Will continue to monitor for progression.   Dr. Emily Filbert is Medical Director for [place] and [facility].

## 2017-05-23 ENCOUNTER — Encounter: Payer: Medicare HMO | Attending: Cardiovascular Disease | Admitting: *Deleted

## 2017-05-23 DIAGNOSIS — Z8619 Personal history of other infectious and parasitic diseases: Secondary | ICD-10-CM | POA: Diagnosis not present

## 2017-05-23 DIAGNOSIS — M109 Gout, unspecified: Secondary | ICD-10-CM | POA: Insufficient documentation

## 2017-05-23 DIAGNOSIS — I2510 Atherosclerotic heart disease of native coronary artery without angina pectoris: Secondary | ICD-10-CM | POA: Diagnosis not present

## 2017-05-23 DIAGNOSIS — Z951 Presence of aortocoronary bypass graft: Secondary | ICD-10-CM | POA: Diagnosis not present

## 2017-05-23 DIAGNOSIS — Z7982 Long term (current) use of aspirin: Secondary | ICD-10-CM | POA: Diagnosis not present

## 2017-05-23 DIAGNOSIS — Z79899 Other long term (current) drug therapy: Secondary | ICD-10-CM | POA: Diagnosis not present

## 2017-05-23 DIAGNOSIS — E785 Hyperlipidemia, unspecified: Secondary | ICD-10-CM | POA: Insufficient documentation

## 2017-05-23 DIAGNOSIS — Z8701 Personal history of pneumonia (recurrent): Secondary | ICD-10-CM | POA: Diagnosis not present

## 2017-05-23 NOTE — Progress Notes (Signed)
Daily Session Note  Patient Details  Name: Praveen Coia MRN: [id number] Date of Birth: 04/01/1951 Referring Provider:     Cardiac Rehab from 03/29/2017 in [facility] Cardiac and Pulmonary Rehab  Referring Provider  Gollan      Encounter Date: 05/23/2017  Check In:     Session Check In - 05/23/17 1731      Check-In   Location [facility]   Staff Present Gerlene Burdock, RN, Vickki Hearing, BA, ACSM CEP, Exercise Physiologist;Meredith Sherryll Burger, RN BSN   Supervising physician immediately available to respond to emergencies See telemetry face sheet for immediately available ER MD   Medication changes reported     No   Fall or balance concerns reported    No   Tobacco Cessation No Change   Warm-up and Cool-down Performed on first and last piece of equipment   Resistance Training Performed Yes   VAD Patient? No     Pain Assessment   Currently in Pain? No/denies         History  Smoking Status  . Never Smoker  Smokeless Tobacco  . Never Used    Goals Met:  Proper associated with RPD/PD & O2 Sat Exercise tolerated well No report of cardiac concerns or symptoms Strength training completed today  Goals Unmet:  Not Applicable  Comments:     Dr. Emily Filbert is Medical Director for [place] and [facility].

## 2017-05-24 ENCOUNTER — Other Ambulatory Visit: Payer: Self-pay | Admitting: Surgery

## 2017-05-24 ENCOUNTER — Other Ambulatory Visit: Payer: Self-pay | Admitting: Physician Assistant

## 2017-05-24 VITALS — Ht 69.0 in | Wt 172.0 lb

## 2017-05-24 DIAGNOSIS — E782 Mixed hyperlipidemia: Secondary | ICD-10-CM

## 2017-05-24 DIAGNOSIS — I2510 Atherosclerotic heart disease of native coronary artery without angina pectoris: Secondary | ICD-10-CM

## 2017-05-24 DIAGNOSIS — Z951 Presence of aortocoronary bypass graft: Secondary | ICD-10-CM

## 2017-05-24 NOTE — Progress Notes (Signed)
Daily Session Note  Patient Details  Name: Jordan Wong MRN: [id number] Date of Birth: 1951-08-17 Referring Provider:     Cardiac Rehab from 03/29/2017 in [facility] Cardiac and Pulmonary Rehab  Referring Provider  Gollan      Encounter Date: 05/24/2017  Check In:     Session Check In - 05/24/17 1625      Check-In   Location [facility] Re[facility]   Staff Present Virgina Organ, RN, [place] Lanius, BA, ACSM CEP, Exercise Physiologist;Kelly Madilyn Fireman, BS, ACSM CEP, Exercise Physiologist;Kellis Topete Hollace Kinnier   Supervising physician immediately available to respond to emergencies See telemetry face sheet for immediately available ER MD   Medication changes reported     No   Fall or balance concerns reported    No   Warm-up and Cool-down Performed on first and last piece of equipment   Resistance Training Performed Yes   VAD Patient? No     Pain Assessment   Currently in Pain? No/denies   Multiple Pain Sites No           Exercise Prescription Changes - 05/24/17 1100      Response to Exercise   Blood Pressure (Admit) 118/60   Blood Pressure (Exercise) 128/66   Blood Pressure (Exit) 118/60   Heart Rate (Admit) 58 bpm   Heart Rate (Exercise) 131 bpm   Heart Rate (Exit) 72 bpm   Rating of Perceived Exertion (Exercise) 14   Symptoms none   Duration Continue with 45 min of aerobic exercise without signs/symptoms of physical distress.   Intensity THRR unchanged     Progression   Progression Continue to progress workloads to maintain intensity without signs/symptoms of physical distress.   Average METs 6.22     Resistance Training   Weight 3   Reps 10-15     Interval Training   Interval Training Yes     Recumbant Bike   Level 3   Watts 71   Minutes 15   METs 5.15     T5 Nustep   Level 3   Minutes 15   METs 7.5      History  Smoking Status  . Never Smoker  Smokeless Tobacco  . Never Used    Goals Met:  Independence with exercise  equipment Exercise tolerated well No report of cardiac concerns or symptoms Strength training completed today  Goals Unmet:  Not Applicable  Comments:      6 Minute Walk    Row Name 03/29/17 1441 05/24/17 1702       6 Minute Walk   Phase Initial Discharge    Distance 1320 feet 2142 feet    Distance % Change  - 62 %    Distance Feet Change  - 822 ft    Walk Time 6 minutes 6 minutes    # of Rest Breaks 0 0    MPH 2.5 4.05    METS 3.35 5.32    RPE 13 15    VO2 Peak 11.7 18.65    Symptoms No No    Resting HR 67 bpm 63 bpm    Resting BP 106/68 110/64    Resting Oxygen Saturation   - 98 %    Exercise Oxygen Saturation  during 6 min walk  - 98 %    Max Ex. HR 100 bpm 110 bpm    Max Ex. BP 130/70 184/76    2 Minute Post BP 108/60  -     Pt able to follow exercise  prescription today without complaint.  Will continue to monitor for progression.   Dr. Emily Filbert is Medical Director for [place] and [facility].

## 2017-05-27 ENCOUNTER — Other Ambulatory Visit: Payer: Self-pay | Admitting: Physician Assistant

## 2017-05-27 DIAGNOSIS — I2510 Atherosclerotic heart disease of native coronary artery without angina pectoris: Secondary | ICD-10-CM

## 2017-05-28 DIAGNOSIS — Z951 Presence of aortocoronary bypass graft: Secondary | ICD-10-CM | POA: Diagnosis not present

## 2017-05-28 NOTE — Progress Notes (Signed)
Daily Session Note  Patient Details  Name: Jordan Wong MRN: [id number] Date of Birth: December 06, 1950 Referring Provider:     Cardiac Rehab from 03/29/2017 in [facility] Cardiac and Pulmonary Rehab  Referring Provider  Gollan      Encounter Date: 05/28/2017  Check In:     Session Check In - 05/28/17 1730      Check-In   Location [facility] Re[facility]   Staff Present Nada Maclachlan, BA, ACSM CEP, Exercise Physiologist;Kelly Amedeo Plenty, BS, ACSM CEP, Exercise Physiologist;Meredith Sherryll Burger, RN BSN   Supervising physician immediately available to respond to emergencies See telemetry face sheet for immediately available ER MD   Medication changes reported     No   Fall or balance concerns reported    No   Warm-up and Cool-down Performed on first and last piece of equipment   Resistance Training Performed Yes   VAD Patient? No     Pain Assessment   Currently in Pain? No/denies         History  Smoking Status  . Never Smoker  Smokeless Tobacco  . Never Used    Goals Met:  Independence with exercise equipment Exercise tolerated well No report of cardiac concerns or symptoms Strength training completed today  Goals Unmet:  Not Applicable  Comments: Pt able to follow exercise prescription today without complaint.  Will continue to monitor for progression.    Dr. Emily Filbert is Medical Director for [place] and [facility].

## 2017-05-30 ENCOUNTER — Encounter: Payer: Medicare HMO | Admitting: *Deleted

## 2017-05-30 ENCOUNTER — Encounter: Payer: Self-pay | Admitting: *Deleted

## 2017-05-30 DIAGNOSIS — Z951 Presence of aortocoronary bypass graft: Secondary | ICD-10-CM

## 2017-05-30 NOTE — Progress Notes (Signed)
Daily Session Note  Patient Details  Name: Jordan Wong MRN: [id number] Date of Birth: 08/02/1951 Referring Provider:     Cardiac Rehab from 03/29/2017 in [facility] Cardiac and Pulmonary Rehab  Referring Provider  Gollan      Encounter Date: 05/30/2017  Check In:     Session Check In - 05/30/17 1704      Check-In   Location [facility] Re[facility]   Staff Present Gerlene Burdock, RN, BSN;Meredith Sherryll Burger, RN Vickki Hearing, BA, ACSM CEP, Exercise Physiologist   Supervising physician immediately available to respond to emergencies See telemetry face sheet for immediately available ER MD   Medication changes reported     No   Fall or balance concerns reported    No   Warm-up and Cool-down Performed on first and last piece of equipment   Resistance Training Performed Yes   VAD Patient? No     Pain Assessment   Currently in Pain? No/denies         History  Smoking Status  . Never Smoker  Smokeless Tobacco  . Never Used    Goals Met:  Proper associated with RPD/PD & O2 Sat Independence with exercise equipment Exercise tolerated well Personal goals reviewed No report of cardiac concerns or symptoms Strength training completed today  Goals Unmet:  Not Applicable  Comments: Pt able to follow exercise prescription today without complaint.  Will continue to monitor for progression.    Dr. Emily Filbert is Medical Director for [place] and [facility].

## 2017-05-30 NOTE — Progress Notes (Signed)
Cardiac Individual Treatment Plan  Patient Details  Name: Jordan Wong MRN: [id number] Date of Birth: [birth year] Referring Provider:     Cardiac Rehab from 03/29/2017 in [facility] Cardiac and Pulmonary Rehab  Referring Provider  Gollan      Initial Encounter Date:    Cardiac Rehab from 03/29/2017 in [facility] Cardiac and Pulmonary Rehab  Date  03/29/17  Referring Provider  Rockey Situ      Visit Diagnosis: S/P CABG x 5  Patient's Home Medications on Admission:  Current Outpatient Prescriptions:  .  acetaminophen (TYLENOL) 325 MG tablet, Take 2 tablets (650 mg total) by mouth every 6 (six) hours as needed for mild pain (or Fever >/= 101)., Disp: , Rfl:  .  aspirin EC 325 MG EC tablet, Take 1 tablet (325 mg total) by mouth daily., Disp: 30 tablet, Rfl: 0 .  atorvastatin (LIPITOR) 40 MG tablet, Take 1 tablet (40 mg total) by mouth daily at 6 PM., Disp: 30 tablet, Rfl: 2 .  colchicine 0.6 MG tablet, Take 0.6 mg by mouth daily as needed. , Disp: , Rfl: 0 .  Melatonin 3 MG TABS, Take by mouth., Disp: , Rfl:  .  metoprolol tartrate (LOPRESSOR) 25 MG tablet, Take 0.5 tablets (12.5 mg total) by mouth 2 (two) times daily., Disp: 60 tablet, Rfl: 1  Past Medical History: Past Medical History:  Diagnosis Date  . CAD (coronary artery disease)    a. 02/2017 MV: septal, apical, dist antsept ischemia, EF 50%, 73m ST dep w/ exercise; b. 02/2017 Cath: LM nl, LAD 99p, 50d, D1 90ost, RI 95ost, LCX 767m, RCA 95p, EF 55-65%; c. 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  . Marland Kitchenepatitis C 2016   a. with hepatic fibrosis - followed [facility]; b. s/p therapy - Viekira Pak (ombitasvir, paritaprevir and ritonavir 12.5/75/5086mlus dasabuvir 250m57m ribavirin x 12 wks as part of Prioritize study.  . History of blood transfusion 1987   "related to leg OR"  . History of echocardiogram    a. 02/2017 Echo: EF 55-60%, no rwma, mild MR, mildly dil LA.  . HiMarland Kitchentory of gout   . HLD (hyperlipidemia)   .  Hyperglycemia   . Pneumonia 2000s X 1    Tobacco Use: History  Smoking Status  . Never Smoker  Smokeless Tobacco  . Never Used    Labs: Recent Review Flowsheet Data    Labs for ITP Cardiac and Pulmonary Rehab Latest Ref Rng & Units 02/23/2017 02/23/2017 02/23/2017 02/23/2017 02/24/2017   Cholestrol 0 - 200 mg/dL - - - - -   LDLCALC 0 - 99 mg/dL - - - - -   HDL >40 mg/dL - - - - -   Trlycerides <150 mg/dL - - - - -   Hemoglobin A1c 4.8 - 5.6 % - - - - -   PHART 7.350 - 7.450 7.355 7.407 7.346(L) - -   PCO2ART 32.0 - 48.0 mmHg 42.7 41.6 41.9 - -   HCO3 20.0 - 28.0 mmol/L 24.0 26.3 22.9 - -   TCO2 0 - 100 mmol/L _0 ACIDBASEDEF 0.0 - 2.0 mmol/L 2.0 - 3.0(H) - -   O2SAT % 97.0 97.0 96.0 - -       Exercise Target Goals:    Exercise Program Goal: Individual exercise prescription set with THRR, safety & activity barriers. Participant demonstrates ability to understand and report RPE using BORG scale, to self-measure pulse accurately, and to acknowledge the importance  of the exercise prescription.  Exercise Prescription Goal: Starting with aerobic activity 30 plus minutes a day, 3 days per week for initial exercise prescription. Provide home exercise prescription and guidelines that participant acknowledges understanding prior to discharge.  Activity Barriers & Risk Stratification:     Activity Barriers & Cardiac Risk Stratification - 03/29/17 1442      Activity Barriers & Cardiac Risk Stratification   Activity Barriers None   Cardiac Risk Stratification High      6 Minute Walk:     6 Minute Walk    Row Name 03/29/17 1441 05/24/17 1702       6 Minute Walk   Phase Initial Discharge    Distance 1320 feet 2142 feet    Distance % Change  - 62 %    Distance Feet Change  - 822 ft    Walk Time 6 minutes 6 minutes    # of Rest Breaks 0 0    MPH 2.5 4.05    METS 3.35 5.32    RPE 13 15    VO2 Peak 11.7 18.65    Symptoms No No    Resting HR 67 bpm 63 bpm     Resting BP 106/68 110/64    Resting Oxygen Saturation   - 98 %    Exercise Oxygen Saturation  during 6 min walk  - 98 %    Max Ex. HR 100 bpm 110 bpm    Max Ex. BP 130/70 184/76    2 Minute Post BP 108/60  -       Oxygen Initial Assessment:   Oxygen Re-Evaluation:   Oxygen Discharge (Final Oxygen Re-Evaluation):   Initial Exercise Prescription:     Initial Exercise Prescription - 03/29/17 1400      Date of Initial Exercise RX and Referring Provider   Date 03/29/17   Referring Provider Gollan     Treadmill   MPH 2.5   Grade 1   Minutes 15   METs 3.35     Recumbant Bike   Level 3   RPM 60   Watts 34   Minutes 15   METs 3.35     T5 Nustep   Level 3   SPM 80   Minutes 15   METs 3      Perform Capillary Blood Glucose checks as needed.  Exercise Prescription Changes:     Exercise Prescription Changes    Row Name 03/29/17 1400 04/11/17 1200 04/19/17 1600 04/24/17 1400 05/11/17 1300     Response to Exercise   Blood Pressure (Admit) 106/68 124/82  - 126/65  -   Blood Pressure (Exercise) 130/70 134/66  - 130/74 138/80   Blood Pressure (Exit) 108/60 110/70  - 122/60 122/70   Heart Rate (Admit) 69 bpm 66 bpm  - 74 bpm  -   Heart Rate (Exercise) 103 bpm 113 bpm  - 118 bpm 134 bpm   Heart Rate (Exit) 68 bpm 73 bpm  - 82 bpm 84 bpm   Rating of Perceived Exertion (Exercise) 13 13  - 13 14   Symptoms  - none  - none none   Duration  - Continue with 45 min of aerobic exercise without signs/symptoms of physical distress.  - Continue with 45 min of aerobic exercise without signs/symptoms of physical distress. Continue with 45 min of aerobic exercise without signs/symptoms of physical distress.   Intensity  - THRR unchanged  - THRR unchanged THRR unchanged     Progression  Progression  - Continue to progress workloads to maintain intensity without signs/symptoms of physical distress.  - Continue to progress workloads to maintain intensity without signs/symptoms of  physical distress. Continue to progress workloads to maintain intensity without signs/symptoms of physical distress.   Average METs  - 3.35  - 4.4 6.02     Resistance Training   Weight  - 3  - 3 lbs 3 lb   Reps  - 10-15  - 10-15 10-15     Interval Training   Interval Training  - No  - No  -     Treadmill   MPH  -  -  - 2.5 3.8   Grade  -  -  - 1 2.5   Minutes  -  -  - 15 15   METs  -  -  - 3.35 5.13     Recumbant Bike   Level  - 3  - 3  -   Watts  - 34  - 34  -   Minutes  - 15  - 15  -   METs  - 3.35  - 3.35  -     T5 Nustep   Level  - 6  - 3 3   Minutes  - 15  - 15 15   METs  -  -  - 6.6 5.48     Home Exercise Plan   Plans to continue exercise at  -  - Home (comment)  walk,  considering FF Home (comment)  walk,  considering FF  -   Frequency  -  - Add 1 additional day to program exercise sessions. Add 1 additional day to program exercise sessions.  -   Initial Home Exercises Provided  -  - 04/19/17 04/19/17  -   Hazen Name 05/24/17 1100             Response to Exercise   Blood Pressure (Admit) 118/60       Blood Pressure (Exercise) 128/66       Blood Pressure (Exit) 118/60       Heart Rate (Admit) 58 bpm       Heart Rate (Exercise) 131 bpm       Heart Rate (Exit) 72 bpm       Rating of Perceived Exertion (Exercise) 14       Symptoms none       Duration Continue with 45 min of aerobic exercise without signs/symptoms of physical distress.       Intensity THRR unchanged         Progression   Progression Continue to progress workloads to maintain intensity without signs/symptoms of physical distress.       Average METs 6.22         Resistance Training   Weight 3       Reps 10-15         Interval Training   Interval Training Yes         Recumbant Bike   Level 3       Watts 71       Minutes 15       METs 5.15         T5 Nustep   Level 3       Minutes 15       METs 7.5          Exercise Comments:     Exercise Comments    Row Name 04/19/17 1640  Exercise Comments Home Exercise reviewed with patient.          Exercise Goals and Review:     Exercise Goals    Row Name 03/29/17 1441             Exercise Goals   Increase Physical Activity Yes       Intervention Provide advice, education, support and counseling about physical activity/exercise needs.;Develop an individualized exercise prescription for aerobic and resistive training based on initial evaluation findings, risk stratification, comorbidities and participant's personal goals.       Expected Outcomes Achievement of increased cardiorespiratory fitness and enhanced flexibility, muscular endurance and strength shown through measurements of functional capacity and personal statement of participant.       Increase Strength and Stamina Yes       Intervention Develop an individualized exercise prescription for aerobic and resistive training based on initial evaluation findings, risk stratification, comorbidities and participant's personal goals.;Provide advice, education, support and counseling about physical activity/exercise needs.       Expected Outcomes Achievement of increased cardiorespiratory fitness and enhanced flexibility, muscular endurance and strength shown through measurements of functional capacity and personal statement of participant.          Exercise Goals Re-Evaluation :     Exercise Goals Re-Evaluation    Row Name 04/11/17 1247 04/19/17 1650 04/24/17 1350 05/11/17 1357 05/24/17 1203     Exercise Goal Re-Evaluation   Exercise Goals Review Increase Physical Activity;Increase Strenth and Stamina Increase Physical Activity Increase Physical Activity;Increase Strenth and Stamina Increase Physical Activity;Increase Strenth and Stamina Increase Physical Activity;Increase Strength and Stamina;Able to understand and use rate of perceived exertion (RPE) scale;Understanding of Exercise Prescription   Comments Cindee Lame has had a successful first week of exercise  and has tolerated exercise well. Home exercise reviewed. Pete plans to exercise at home by walking.  Cindee Lame has been doing well in rehab.  He is up to 6.6 METs on the NuStep and 50 watts on the bike.  We will continue to monitor his progress. Cindee Lame has increased overall MET level greatly.  He continues to tolerate exercise well. Cindee Lame has tolerated interval training well and stated he is less fatigued.   Expected Outcomes Short - Cindee Lame will attend HT regularly.  Long - Cindee Lame will establish consisten exercise habits. Short: Cindee Lame will add one day of exercise. Long: Cindee Lame will exercise on his own outside of class. Short: Continue to work on improving workloads.  Long: Continue to exercise some independently. Short - Cindee Lame will maintain regular attendance and current level of fitness.  Long - Cindee Lame will maintain fitness on his own. Short - Cindee Lame will continue interval training.  Long - Cindee Lame will maintain exercise on his own.   Row Name 05/24/17 1705             Exercise Goal Re-Evaluation   Comments 6 Minute walk test completed and reviewed with patient.          Discharge Exercise Prescription (Final Exercise Prescription Changes):     Exercise Prescription Changes - 05/24/17 1100      Response to Exercise   Blood Pressure (Admit) 118/60   Blood Pressure (Exercise) 128/66   Blood Pressure (Exit) 118/60   Heart Rate (Admit) 58 bpm   Heart Rate (Exercise) 131 bpm   Heart Rate (Exit) 72 bpm   Rating of Perceived Exertion (Exercise) 14   Symptoms none   Duration Continue with 45 min of aerobic exercise without signs/symptoms of physical distress.  Intensity THRR unchanged     Progression   Progression Continue to progress workloads to maintain intensity without signs/symptoms of physical distress.   Average METs 6.22     Resistance Training   Weight 3   Reps 10-15     Interval Training   Interval Training Yes     Recumbant Bike   Level 3   Watts 71   Minutes 15   METs 5.15     T5  Nustep   Level 3   Minutes 15   METs 7.5      Nutrition:  Target Goals: Understanding of nutrition guidelines, daily intake of sodium '1500mg'$ , cholesterol '200mg'$ , calories 30% from fat and 7% or less from saturated fats, daily to have 5 or more servings of fruits and vegetables.  Biometrics:     Pre Biometrics - 03/29/17 1440      Pre Biometrics   Height '5\' 9"'$  (1.753 m)   Weight 173 lb 8 oz (78.7 kg)   Waist Circumference 39.75 inches   Hip Circumference 39.5 inches   Waist to Hip Ratio 1.01 %   BMI (Calculated) 25.7   Single Leg Stand 16.57 seconds         Post Biometrics - 05/24/17 1704       Post  Biometrics   Height '5\' 9"'$  (1.753 m)   Weight 172 lb (78 kg)   Waist Circumference 37.5 inches   Hip Circumference 39 inches   Waist to Hip Ratio 0.96 %   BMI (Calculated) 25.39   Single Leg Stand 21.31 seconds      Nutrition Therapy Plan and Nutrition Goals:     Nutrition Therapy & Goals - 05/25/17 1252      Nutrition Therapy   RD appointment defered Yes      Nutrition Discharge: Rate Your Plate Scores:     Nutrition Assessments - 05/29/17 1300      MEDFICTS Scores   Post Score 4      Nutrition Goals Re-Evaluation:     Nutrition Goals Re-Evaluation    Dubberly Name 04/26/17 1652 05/25/17 1248           Goals   Comment rd appoinment deferred.  Has made healthy changes and is doing well with the changes. About ready to graduate from Cardiac Rehab. Cardiac Rehab REgistered Dietician appt deferred still      Expected Outcome Will let staff know if he changes his mind to meet with the RD Heart healthy eating         Nutrition Goals Discharge (Final Nutrition Goals Re-Evaluation):     Nutrition Goals Re-Evaluation - 05/25/17 1248      Goals   Comment About ready to graduate from Cardiac Rehab. Cardiac Rehab REgistered Dietician appt deferred still   Expected Outcome Heart healthy eating      Psychosocial: Target Goals: Acknowledge presence or  absence of significant depression and/or stress, maximize coping skills, provide positive support system. Participant is able to verbalize types and ability to use techniques and skills needed for reducing stress and depression.   Initial Review & Psychosocial Screening:     Initial Psych Review & Screening - 03/29/17 1439      Initial Review   Current issues with None Identified     Family Dynamics   Good Support System? Yes  Wife     Barriers   Psychosocial barriers to participate in program There are no identifiable barriers or psychosocial needs.     Screening  Interventions   Interventions Encouraged to exercise;Program counselor consult      Quality of Life Scores:      Quality of Life - 05/29/17 1258      Quality of Life Scores   Health/Function Post 26.8 %   Socioeconomic Post 27.43 %   Psych/Spiritual Post 29.14 %   Family Post 26.4 %   GLOBAL Post 27.35 %      PHQ-9: Recent Review Flowsheet Data    Depression screen [facility] 2/9 03/29/2017   Decreased Interest 0   Down, Depressed, Hopeless 0   PHQ - 2 Score 0   Altered sleeping 1   Tired, decreased energy 1   Change in appetite 0   Feeling bad or failure about yourself  0   Trouble concentrating 0   Moving slowly or fidgety/restless 0   Suicidal thoughts 0   PHQ-9 Score 2   Difficult doing work/chores Not difficult at all     Interpretation of Total Score  Total Score Depression Severity:  1-4 = Minimal depression, 5-9 = Mild depression, 10-14 = Moderate depression, 15-19 = Moderately severe depression, 20-27 = Severe depression   Psychosocial Evaluation and Intervention:     Psychosocial Evaluation - 04/04/17 1717      Psychosocial Evaluation & Interventions   Interventions Encouraged to exercise with the program and follow exercise prescription;Stress management education;Relaxation education   Comments Counselor met with Mr. Geister Theron Arista) today for initial psychosocial evaluation.  He is a 66 year  old who had open heart surgery recently with a CABGx5.  Theron Arista has a strong support system with a spouse of 13 years and several adult daughters locally; as well as active involvement in his local church.  Theron Arista reports having some chronic intermittent sleep problems over the past (3) years and takes an OTC sleep aid to help.  He reports recently this has improved.  He has a good appetite as well.  Theron Arista denies a history of depression or anxiety and reports he is typically in a positive mood.  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  He has goals to increase his stamina and strength and possibly lose some weight while in this program.  Counselor encouraged Theron Arista to exercise consistently for his sleep issues as well as his stress issues to assist with coping.  Staff will follow with Theron Arista throughout the course of this program.    Expected Outcomes Theron Arista will benefit from consistent exercise to achieve his stated goals.  He also will benefit from participating in the educational components of this program and psychoeducational to learn more about coping strategies and relaxation to help with his stress management.  Theron Arista will meet with the dietician to address his weight loss goals.     Continue Psychosocial Services  Follow up required by staff      Psychosocial Re-Evaluation:     Psychosocial Re-Evaluation    Row Name 04/26/17 1634 05/25/17 1249           Psychosocial Re-Evaluation   Current issues with None Identified  -      Comments Cindee Lame has had no changes regarding stress since he talked with Olegario Messier.  He was reminded to talk to the staff if any concerns happen. About ready to graduate from Cardiac REhab. 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      Expected Outcomes  Short and long term: Continue without stress and be ware to report any concerns Cont to keep his  stress levels low.       Continue Psychosocial Services  Follow up required by staff  -         Psychosocial Discharge (Final Psychosocial Re-Evaluation):     Psychosocial Re-Evaluation - 05/25/17 1249      Psychosocial Re-Evaluation   Comments About ready to graduate from Cardiac REhab. 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   Expected Outcomes Cont to keep his stress levels low.       Vocational Rehabilitation: Provide vocational rehab assistance to qualifying candidates.   Vocational Rehab Evaluation & Intervention:     Vocational Rehab - 03/29/17 1440      Initial Vocational Rehab Evaluation & Intervention   Assessment shows need for Vocational Rehabilitation No      Education: Education Goals: Education classes will be provided on a variety of topics geared toward better understanding of heart health and risk factor modification. Participant will state understanding/return demonstration of topics presented as noted by education test scores.  Learning Barriers/Preferences:     Learning Barriers/Preferences - 03/29/17 1440      Learning Barriers/Preferences   Learning Barriers None   Learning Preferences Individual Instruction      Education Topics: General Nutrition Guidelines/Fats and Fiber: -Group instruction provided by verbal, written material, models and posters to present the general guidelines for heart healthy nutrition. Gives an explanation and review of dietary fats and fiber.   Cardiac Rehab from 05/28/2017 in [facility] Cardiac and Pulmonary Rehab  Date  05/07/17  Educator  PI      Controlling Sodium/Reading Food Labels: -Group verbal and written material supporting the discussion of sodium use in heart healthy nutrition. Review and explanation with models, verbal and written materials for utilization of the food label.   Cardiac Rehab from 05/28/2017 in [facility] Cardiac and Pulmonary Rehab  Date   05/14/17  Educator  PI  Instruction Review Code  1- Verbalizes Understanding      Exercise Physiology & Risk Factors: - Group verbal and written instruction with models to review the exercise physiology of the cardiovascular system and associated critical values. Details cardiovascular disease risk factors and the goals associated with each risk factor.   Cardiac Rehab from 05/28/2017 in [facility] Cardiac and Pulmonary Rehab  Date  05/23/17  Educator  Nada Maclachlan, EP  Instruction Review Code  2- Demonstrated Understanding      Aerobic Exercise & Resistance Training: - Gives group verbal and written discussion on the health impact of inactivity. On the components of aerobic and resistive training programs and the benefits of this training and how to safely progress through these programs.   Cardiac Rehab from 05/28/2017 in [facility] Cardiac and Pulmonary Rehab  Date  05/28/17  Educator  [facility]  Instruction Review Code  1- Geologist, engineering, Balance, General Exercise Guidelines: - Provides group verbal and written instruction on the benefits of flexibility and balance training programs. Provides general exercise guidelines with specific guidelines to those with heart or lung disease. Demonstration and skill practice provided.   Stress Management: - Provides group verbal and written instruction about the health risks of elevated stress, cause of high stress, and healthy ways to reduce stress.   Cardiac Rehab from 05/28/2017 in [facility] Cardiac and Pulmonary Rehab  Date  04/11/17  Educator  Lupita Leash  Depression: - Provides group verbal and written instruction on the correlation between heart/lung disease and depressed mood, treatment options, and the stigmas associated with seeking treatment.   Cardiac Rehab from 05/28/2017 in [facility] Cardiac and Pulmonary Rehab  Date  05/09/17  Educator  [facility]  Instruction Review Code (retired)  2- meets Building services engineer & Physiology  of the Heart: - Group verbal and written instruction and models provide basic cardiac anatomy and physiology, with the coronary electrical and arterial systems. Review of: AMI, Angina, Valve disease, Heart Failure, Cardiac Arrhythmia, Pacemakers, and the ICD.   Cardiac Rehab from 05/28/2017 in [facility] Cardiac and Pulmonary Rehab  Date  04/09/17  Educator  [facility]      Cardiac Procedures: - Group verbal and written instruction to review commonly prescribed medications for heart disease. Reviews the medication, class of the drug, and side effects. Includes the steps to properly store meds and maintain the prescription regimen. (beta blockers and nitrates)   Cardiac Rehab from 05/28/2017 in [facility] Cardiac and Pulmonary Rehab  Date  04/16/17  Educator  [facility]      Cardiac Medications I: - Group verbal and written instruction to review commonly prescribed medications for heart disease. Reviews the medication, class of the drug, and side effects. Includes the steps to properly store meds and maintain the prescription regimen.   Cardiac Rehab from 05/28/2017 in [facility] Cardiac and Pulmonary Rehab  Date  04/23/17 [Part 2 04/25/17]  Educator  MJA , RN [part 2 SB]      Cardiac Medications II: -Group verbal and written instruction to review commonly prescribed medications for heart disease. Reviews the medication, class of the drug, and side effects. (all other drug classes)    Go Sex-Intimacy & Heart Disease, Get SMART - Goal Setting: - Group verbal and written instruction through game format to discuss heart disease and the return to sexual intimacy. Provides group verbal and written material to discuss and apply goal setting through the application of the S.M.A.R.T. Method.   Cardiac Rehab from 05/28/2017 in [facility] Cardiac and Pulmonary Rehab  Date  04/16/17  Educator  [facility]      Other Matters of the Heart: - Provides group verbal, written materials and models to describe Heart Failure, Angina, Valve Disease,  Peripheral Artery Disease, and Diabetes in the realm of heart disease. Includes description of the disease process and treatment options available to the cardiac patient.   Cardiac Rehab from 05/28/2017 in [facility] Cardiac and Pulmonary Rehab  Date  04/09/17  Educator  [facility]      Exercise & Equipment Safety: - Individual verbal instruction and demonstration of equipment use and safety with use of the equipment.   Cardiac Rehab from 05/28/2017 in [facility] Cardiac and Pulmonary Rehab  Date  03/29/17  Educator  [facility]      Infection Prevention: - Provides verbal and written material to individual with discussion of infection control including proper hand washing and proper equipment cleaning during exercise session.   Cardiac Rehab from 05/28/2017 in [facility] Cardiac and Pulmonary Rehab  Date  03/29/17  Educator  [facility]      Falls Prevention: - Provides verbal and written material to individual with discussion of falls prevention and safety.   Cardiac Rehab from 05/28/2017 in [facility] Cardiac and Pulmonary Rehab  Date  03/29/17  Educator  [facility]  Instruction Review Code (retired)  2- meets goals/outcomes      Diabetes: - Individual verbal and written instruction to review signs/symptoms  of diabetes, desired ranges of glucose level fasting, after meals and with exercise. Acknowledge that pre and post exercise glucose checks will be done for 3 sessions at entry of program.   Cardiac Rehab from 05/28/2017 in [facility] Cardiac and Pulmonary Rehab  Date  03/29/17  Educator  [place]      Other: -Provides group and verbal instruction on various topics (see comments)    Knowledge Questionnaire Score:     Knowledge Questionnaire Score - 05/29/17 1257      Knowledge Questionnaire Score   Post Score 27/28      Core Components/Risk Factors/Patient Goals at Admission:     Personal Goals and Risk Factors at Admission - 03/29/17 1438      Core Components/Risk Factors/Patient Goals on Admission   Lipids Yes    Intervention Provide education and support for participant on nutrition & aerobic/resistive exercise along with prescribed medications to achieve LDL '70mg'$ , HDL >'40mg'$ .   Expected Outcomes Short Term: Participant states understanding of desired cholesterol values and is compliant with medications prescribed. Participant is following exercise prescription and nutrition guidelines.;Long Term: Cholesterol controlled with medications as prescribed, with individualized exercise RX and with personalized nutrition plan. Value goals: LDL < '70mg'$ , HDL > 40 mg.   Stress Yes   Intervention Offer individual and/or small group education and counseling on adjustment to heart disease, stress management and health-related lifestyle change. Teach and support self-help strategies.;Refer participants experiencing significant psychosocial distress to appropriate mental health specialists for further evaluation and treatment. When possible, include family members and significant others in education/counseling sessions.   Expected Outcomes Short Term: Participant demonstrates changes in health-related behavior, relaxation and other stress management skills, ability to obtain effective social support, and compliance with psychotropic medications if prescribed.;Long Term: Emotional wellbeing is indicated by absence of clinically significant psychosocial distress or social isolation.      Core Components/Risk Factors/Patient Goals Review:      Goals and Risk Factor Review    Row Name 04/26/17 1647 05/25/17 1249           Core Components/Risk Factors/Patient Goals Review   Personal Goals Review Lipids  -      Review Spoke with Laurey Arrow about his cholesterol, he verbalized understanding about keeping his risk factor under control by being compliant with his meds, good nutrition and exercise.   will follow up with his MD about his lipids      Expected Outcomes Short and long term   Adherence with meds, nutrition and exercise for  great risk factor control Heart healthy living         Core Components/Risk Factors/Patient Goals at Discharge (Final Review):      Goals and Risk Factor Review - 05/25/17 1249      Core Components/Risk Factors/Patient Goals Review   Review will follow up with his MD about his lipids   Expected Outcomes Heart healthy living      ITP Comments:     ITP Comments    Row Name 03/29/17 1436 04/04/17 0716 05/02/17 0640 05/30/17 1138     ITP Comments Med review completed. Initial ITP created. Diagnosis can be found in [facility] 6/13 30 day review. Continue with ITP unless directed changes per Medical Director review    New to Program 30 day review. Continue with ITP unless directed changes per Medical Director review  30 day review. Continue with ITP unless directed changes per Medical Director review.         Comments:

## 2017-05-30 NOTE — Patient Instructions (Signed)
Discharge Instructions  Patient Details  Name: Jordan Wong MRN: [id number] Date of Birth: May 17, 1951 Referring Provider:  Minna Merritts, MD   Number of Visits: 35  Reason for Discharge:  Patient reached a stable level of exercise. Patient independent in their exercise. Patient has met program and personal goals.  Smoking History:  History  Smoking Status  . Never Smoker  Smokeless Tobacco  . Never Used    Diagnosis:  S/P CABG x 5  Initial Exercise Prescription:     Initial Exercise Prescription - 03/29/17 1400      Date of Initial Exercise RX and Referring Provider   Date 03/29/17   Referring Provider Gollan     Treadmill   MPH 2.5   Grade 1   Minutes 15   METs 3.35     Recumbant Bike   Level 3   RPM 60   Watts 34   Minutes 15   METs 3.35     T5 Nustep   Level 3   SPM 80   Minutes 15   METs 3      Discharge Exercise Prescription (Final Exercise Prescription Changes):     Exercise Prescription Changes - 05/24/17 1100      Response to Exercise   Blood Pressure (Admit) 118/60   Blood Pressure (Exercise) 128/66   Blood Pressure (Exit) 118/60   Heart Rate (Admit) 58 bpm   Heart Rate (Exercise) 131 bpm   Heart Rate (Exit) 72 bpm   Rating of Perceived Exertion (Exercise) 14   Symptoms none   Duration Continue with 45 min of aerobic exercise without signs/symptoms of physical distress.   Intensity THRR unchanged     Progression   Progression Continue to progress workloads to maintain intensity without signs/symptoms of physical distress.   Average METs 6.22     Resistance Training   Weight 3   Reps 10-15     Interval Training   Interval Training Yes     Recumbant Bike   Level 3   Watts 71   Minutes 15   METs 5.15     T5 Nustep   Level 3   Minutes 15   METs 7.5      Functional Capacity:     6 Minute Walk    Row Name 03/29/17 1441 05/24/17 1702       6 Minute Walk   Phase Initial Discharge    Distance 1320 feet 2142 feet     Distance % Change  - 62 %    Distance Feet Change  - 822 ft    Walk Time 6 minutes 6 minutes    # of Rest Breaks 0 0    MPH 2.5 4.05    METS 3.35 5.32    RPE 13 15    VO2 Peak 11.7 18.65    Symptoms No No    Resting HR 67 bpm 63 bpm    Resting BP 106/68 110/64    Resting Oxygen Saturation   - 98 %    Exercise Oxygen Saturation  during 6 min walk  - 98 %    Max Ex. HR 100 bpm 110 bpm    Max Ex. BP 130/70 184/76    2 Minute Post BP 108/60  -       Quality of Life:     Quality of Life - 05/29/17 1258      Quality of Life Scores   Health/Function Post 26.8 %   Socioeconomic Post 27.43 %  Psych/Spiritual Post 29.14 %   Family Post 26.4 %   GLOBAL Post 27.35 %      Personal Goals: Goals established at orientation with interventions provided to work toward goal.     Personal Goals and Risk Factors at Admission - 03/29/17 1438      Core Components/Risk Factors/Patient Goals on Admission   Lipids Yes   Intervention Provide education and support for participant on nutrition & aerobic/resistive exercise along with prescribed medications to achieve LDL <19m, HDL >473m   Expected Outcomes Short Term: Participant states understanding of desired cholesterol values and is compliant with medications prescribed. Participant is following exercise prescription and nutrition guidelines.;Long Term: Cholesterol controlled with medications as prescribed, with individualized exercise RX and with personalized nutrition plan. Value goals: LDL < 7047mHDL > 40 mg.   Stress Yes   Intervention Offer individual and/or small group education and counseling on adjustment to heart disease, stress management and health-related lifestyle change. Teach and support self-help strategies.;Refer participants experiencing significant psychosocial distress to appropriate mental health specialists for further evaluation and treatment. When possible, include family members and significant others in  education/counseling sessions.   Expected Outcomes Short Term: Participant demonstrates changes in health-related behavior, relaxation and other stress management skills, ability to obtain effective social support, and compliance with psychotropic medications if prescribed.;Long Term: Emotional wellbeing is indicated by absence of clinically significant psychosocial distress or social isolation.       Personal Goals Discharge:     Goals and Risk Factor Review - 05/25/17 1249      Core Components/Risk Factors/Patient Goals Review   Review will follow up with his MD about his lipids   Expected Outcomes Heart healthy living      Exercise Goals and Review:     Exercise Goals    Row Name 03/29/17 1441             Exercise Goals   Increase Physical Activity Yes       Intervention Provide advice, education, support and counseling about physical activity/exercise needs.;Develop an individualized exercise prescription for aerobic and resistive training based on initial evaluation findings, risk stratification, comorbidities and participant's personal goals.       Expected Outcomes Achievement of increased cardiorespiratory fitness and enhanced flexibility, muscular endurance and strength shown through measurements of functional capacity and personal statement of participant.       Increase Strength and Stamina Yes       Intervention Develop an individualized exercise prescription for aerobic and resistive training based on initial evaluation findings, risk stratification, comorbidities and participant's personal goals.;Provide advice, education, support and counseling about physical activity/exercise needs.       Expected Outcomes Achievement of increased cardiorespiratory fitness and enhanced flexibility, muscular endurance and strength shown through measurements of functional capacity and personal statement of participant.          Nutrition & Weight - Outcomes:     Pre Biometrics -  03/29/17 1440      Pre Biometrics   Height 5' 9" (1.753 m)   Weight 173 lb 8 oz (78.7 kg)   Waist Circumference 39.75 inches   Hip Circumference 39.5 inches   Waist to Hip Ratio 1.01 %   BMI (Calculated) 25.7   Single Leg Stand 16.57 seconds         Post Biometrics - 05/24/17 1704       Post  Biometrics   Height 5' 9" (1.753 m)   Weight 172 lb (78 kg)  Waist Circumference 37.5 inches   Hip Circumference 39 inches   Waist to Hip Ratio 0.96 %   BMI (Calculated) 25.39   Single Leg Stand 21.31 seconds      Nutrition:     Nutrition Therapy & Goals - 05/25/17 1252      Nutrition Therapy   RD appointment defered Yes      Nutrition Discharge:     Nutrition Assessments - 05/29/17 1300      MEDFICTS Scores   Post Score 4      Education Questionnaire Score:     Knowledge Questionnaire Score - 05/29/17 1257      Knowledge Questionnaire Score   Post Score 27/28      Goals reviewed with patient; copy given to patient.

## 2017-05-31 ENCOUNTER — Encounter: Payer: Medicare HMO | Admitting: *Deleted

## 2017-05-31 DIAGNOSIS — Z951 Presence of aortocoronary bypass graft: Secondary | ICD-10-CM

## 2017-05-31 NOTE — Progress Notes (Signed)
Cardiac Individual Treatment Plan  Patient Details  Name: Jordan Wong MRN: [id number] Date of Birth: [birth year] Referring Provider:     Cardiac Rehab from 03/29/2017 in [facility] Cardiac and Pulmonary Rehab  Referring Provider  Gollan      Initial Encounter Date:    Cardiac Rehab from 03/29/2017 in [facility] Cardiac and Pulmonary Rehab  Date  03/29/17  Referring Provider  Rockey Situ      Visit Diagnosis: S/P CABG x 5  Patient's Home Medications on Admission:  Current Outpatient Prescriptions:  .  acetaminophen (TYLENOL) 325 MG tablet, Take 2 tablets (650 mg total) by mouth every 6 (six) hours as needed for mild pain (or Fever >/= 101)., Disp: , Rfl:  .  aspirin EC 325 MG EC tablet, Take 1 tablet (325 mg total) by mouth daily., Disp: 30 tablet, Rfl: 0 .  atorvastatin (LIPITOR) 40 MG tablet, Take 1 tablet (40 mg total) by mouth daily at 6 PM., Disp: 30 tablet, Rfl: 2 .  colchicine 0.6 MG tablet, Take 0.6 mg by mouth daily as needed. , Disp: , Rfl: 0 .  Melatonin 3 MG TABS, Take by mouth., Disp: , Rfl:  .  metoprolol tartrate (LOPRESSOR) 25 MG tablet, Take 0.5 tablets (12.5 mg total) by mouth 2 (two) times daily., Disp: 60 tablet, Rfl: 1  Past Medical History: Past Medical History:  Diagnosis Date  . CAD (coronary artery disease)    a. 02/2017 MV: septal, apical, dist antsept ischemia, EF 50%, 73m ST dep w/ exercise; b. 02/2017 Cath: LM nl, LAD 99p, 50d, D1 90ost, RI 95ost, LCX 767m, RCA 95p, EF 55-65%; c. 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  . Marland Kitchenepatitis C 2016   a. with hepatic fibrosis - followed at [facility]; b. s/p therapy - Viekira Pak (ombitasvir, paritaprevir and ritonavir 12.5/75/5086mlus dasabuvir 250m57m ribavirin x 12 wks as part of Prioritize study.  . History of blood transfusion 1987   "related to leg OR"  . History of echocardiogram    a. 02/2017 Echo: EF 55-60%, no rwma, mild MR, mildly dil LA.  . HiMarland Kitchentory of gout   . HLD (hyperlipidemia)   .  Hyperglycemia   . Pneumonia 2000s X 1    Tobacco Use: History  Smoking Status  . Never Smoker  Smokeless Tobacco  . Never Used    Labs: Recent Review Flowsheet Data    Labs for ITP Cardiac and Pulmonary Rehab Latest Ref Rng & Units 02/23/2017 02/23/2017 02/23/2017 02/23/2017 02/24/2017   Cholestrol 0 - 200 mg/dL - - - - -   LDLCALC 0 - 99 mg/dL - - - - -   HDL >40 mg/dL - - - - -   Trlycerides <150 mg/dL - - - - -   Hemoglobin A1c 4.8 - 5.6 % - - - - -   PHART 7.350 - 7.450 7.355 7.407 7.346(L) - -   PCO2ART 32.0 - 48.0 mmHg 42.7 41.6 41.9 - -   HCO3 20.0 - 28.0 mmol/L 24.0 26.3 22.9 - -   TCO2 0 - 100 mmol/L _0 ACIDBASEDEF 0.0 - 2.0 mmol/L 2.0 - 3.0(H) - -   O2SAT % 97.0 97.0 96.0 - -       Exercise Target Goals:    Exercise Program Goal: Individual exercise prescription set with THRR, safety & activity barriers. Participant demonstrates ability to understand and report RPE using BORG scale, to self-measure pulse accurately, and to acknowledge the importance  of the exercise prescription.  Exercise Prescription Goal: Starting with aerobic activity 30 plus minutes a day, 3 days per week for initial exercise prescription. Provide home exercise prescription and guidelines that participant acknowledges understanding prior to discharge.  Activity Barriers & Risk Stratification:     Activity Barriers & Cardiac Risk Stratification - 03/29/17 1442      Activity Barriers & Cardiac Risk Stratification   Activity Barriers None   Cardiac Risk Stratification High      6 Minute Walk:     6 Minute Walk    Row Name 03/29/17 1441 05/24/17 1702       6 Minute Walk   Phase Initial Discharge    Distance 1320 feet 2142 feet    Distance % Change  - 62 %    Distance Feet Change  - 822 ft    Walk Time 6 minutes 6 minutes    # of Rest Breaks 0 0    MPH 2.5 4.05    METS 3.35 5.32    RPE 13 15    VO2 Peak 11.7 18.65    Symptoms No No    Resting HR 67 bpm 63 bpm     Resting BP 106/68 110/64    Resting Oxygen Saturation   - 98 %    Exercise Oxygen Saturation  during 6 min walk  - 98 %    Max Ex. HR 100 bpm 110 bpm    Max Ex. BP 130/70 184/76    2 Minute Post BP 108/60  -       Oxygen Initial Assessment:   Oxygen Re-Evaluation:   Oxygen Discharge (Final Oxygen Re-Evaluation):   Initial Exercise Prescription:     Initial Exercise Prescription - 03/29/17 1400      Date of Initial Exercise RX and Referring Provider   Date 03/29/17   Referring Provider Gollan     Treadmill   MPH 2.5   Grade 1   Minutes 15   METs 3.35     Recumbant Bike   Level 3   RPM 60   Watts 34   Minutes 15   METs 3.35     T5 Nustep   Level 3   SPM 80   Minutes 15   METs 3      Perform Capillary Blood Glucose checks as needed.  Exercise Prescription Changes:     Exercise Prescription Changes    Row Name 03/29/17 1400 04/11/17 1200 04/19/17 1600 04/24/17 1400 05/11/17 1300     Response to Exercise   Blood Pressure (Admit) 106/68 124/82  - 126/65  -   Blood Pressure (Exercise) 130/70 134/66  - 130/74 138/80   Blood Pressure (Exit) 108/60 110/70  - 122/60 122/70   Heart Rate (Admit) 69 bpm 66 bpm  - 74 bpm  -   Heart Rate (Exercise) 103 bpm 113 bpm  - 118 bpm 134 bpm   Heart Rate (Exit) 68 bpm 73 bpm  - 82 bpm 84 bpm   Rating of Perceived Exertion (Exercise) 13 13  - 13 14   Symptoms  - none  - none none   Duration  - Continue with 45 min of aerobic exercise without signs/symptoms of physical distress.  - Continue with 45 min of aerobic exercise without signs/symptoms of physical distress. Continue with 45 min of aerobic exercise without signs/symptoms of physical distress.   Intensity  - THRR unchanged  - THRR unchanged THRR unchanged     Progression  Progression  - Continue to progress workloads to maintain intensity without signs/symptoms of physical distress.  - Continue to progress workloads to maintain intensity without signs/symptoms of  physical distress. Continue to progress workloads to maintain intensity without signs/symptoms of physical distress.   Average METs  - 3.35  - 4.4 6.02     Resistance Training   Weight  - 3  - 3 lbs 3 lb   Reps  - 10-15  - 10-15 10-15     Interval Training   Interval Training  - No  - No  -     Treadmill   MPH  -  -  - 2.5 3.8   Grade  -  -  - 1 2.5   Minutes  -  -  - 15 15   METs  -  -  - 3.35 5.13     Recumbant Bike   Level  - 3  - 3  -   Watts  - 34  - 34  -   Minutes  - 15  - 15  -   METs  - 3.35  - 3.35  -     T5 Nustep   Level  - 6  - 3 3   Minutes  - 15  - 15 15   METs  -  -  - 6.6 5.48     Home Exercise Plan   Plans to continue exercise at  -  - Home (comment)  walk,  considering FF Home (comment)  walk,  considering FF  -   Frequency  -  - Add 1 additional day to program exercise sessions. Add 1 additional day to program exercise sessions.  -   Initial Home Exercises Provided  -  - 04/19/17 04/19/17  -   [place] Name 05/24/17 1100             Response to Exercise   Blood Pressure (Admit) 118/60       Blood Pressure (Exercise) 128/66       Blood Pressure (Exit) 118/60       Heart Rate (Admit) 58 bpm       Heart Rate (Exercise) 131 bpm       Heart Rate (Exit) 72 bpm       Rating of Perceived Exertion (Exercise) 14       Symptoms none       Duration Continue with 45 min of aerobic exercise without signs/symptoms of physical distress.       Intensity THRR unchanged         Progression   Progression Continue to progress workloads to maintain intensity without signs/symptoms of physical distress.       Average METs 6.22         Resistance Training   Weight 3       Reps 10-15         Interval Training   Interval Training Yes         Recumbant Bike   Level 3       Watts 71       Minutes 15       METs 5.15         T5 Nustep   Level 3       Minutes 15       METs 7.5          Exercise Comments:     Exercise Comments    Row Name 04/19/17 1640  05/31/17  1713         Exercise Comments Home Exercise reviewed with patient.  Odus graduated today from cardiac rehab with 36 sessions completed.  Details of the patient's exercise prescription and what he needs to do in order to continue the prescription and progress were discussed with patient.  Patient was given a copy of prescription and goals.  Patient verbalized understanding.  Moustafa plans to continue to exercise by joining a community fitness center.         Exercise Goals and Review:     Exercise Goals    Row Name 03/29/17 1441             Exercise Goals   Increase Physical Activity Yes       Intervention Provide advice, education, support and counseling about physical activity/exercise needs.;Develop an individualized exercise prescription for aerobic and resistive training based on initial evaluation findings, risk stratification, comorbidities and participant's personal goals.       Expected Outcomes Achievement of increased cardiorespiratory fitness and enhanced flexibility, muscular endurance and strength shown through measurements of functional capacity and personal statement of participant.       Increase Strength and Stamina Yes       Intervention Develop an individualized exercise prescription for aerobic and resistive training based on initial evaluation findings, risk stratification, comorbidities and participant's personal goals.;Provide advice, education, support and counseling about physical activity/exercise needs.       Expected Outcomes Achievement of increased cardiorespiratory fitness and enhanced flexibility, muscular endurance and strength shown through measurements of functional capacity and personal statement of participant.          Exercise Goals Re-Evaluation :     Exercise Goals Re-Evaluation    Row Name 04/11/17 1247 04/19/17 1650 04/24/17 1350 05/11/17 1357 05/24/17 1203     Exercise Goal Re-Evaluation   Exercise Goals Review Increase Physical  Activity;Increase Strenth and Stamina Increase Physical Activity Increase Physical Activity;Increase Strenth and Stamina Increase Physical Activity;Increase Strenth and Stamina Increase Physical Activity;Increase Strength and Stamina;Able to understand and use rate of perceived exertion (RPE) scale;Understanding of Exercise Prescription   Comments Laurey Arrow has had a successful first week of exercise and has tolerated exercise well. Home exercise reviewed. Pete plans to exercise at home by walking.  Laurey Arrow has been doing well in rehab.  He is up to 6.6 METs on the NuStep and 50 watts on the bike.  We will continue to monitor his progress. Laurey Arrow has increased overall MET level greatly.  He continues to tolerate exercise well. Laurey Arrow has tolerated interval training well and stated he is less fatigued.   Expected Outcomes Short - Laurey Arrow will attend HT regularly.  Long - Laurey Arrow will establish consisten exercise habits. Short: Laurey Arrow will add one day of exercise. Long: Laurey Arrow will exercise on his own outside of class. Short: Continue to work on improving workloads.  Long: Continue to exercise some independently. Short - Laurey Arrow will maintain regular attendance and current level of fitness.  Long - Laurey Arrow will maintain fitness on his own. Short - Laurey Arrow will continue interval training.  Long - Laurey Arrow will maintain exercise on his own.   [place] Name 05/24/17 1705             Exercise Goal Re-Evaluation   Comments 6 Minute walk test completed and reviewed with patient.          Discharge Exercise Prescription (Final Exercise Prescription Changes):     Exercise Prescription Changes - 05/24/17 1100  Response to Exercise   Blood Pressure (Admit) 118/60   Blood Pressure (Exercise) 128/66   Blood Pressure (Exit) 118/60   Heart Rate (Admit) 58 bpm   Heart Rate (Exercise) 131 bpm   Heart Rate (Exit) 72 bpm   Rating of Perceived Exertion (Exercise) 14   Symptoms none   Duration Continue with 45 min of aerobic exercise without  signs/symptoms of physical distress.   Intensity THRR unchanged     Progression   Progression Continue to progress workloads to maintain intensity without signs/symptoms of physical distress.   Average METs 6.22     Resistance Training   Weight 3   Reps 10-15     Interval Training   Interval Training Yes     Recumbant Bike   Level 3   Watts 71   Minutes 15   METs 5.15     T5 Nustep   Level 3   Minutes 15   METs 7.5      Nutrition:  Target Goals: Understanding of nutrition guidelines, daily intake of sodium <1583m, cholesterol <2045m calories 30% from fat and 7% or less from saturated fats, daily to have 5 or more servings of fruits and vegetables.  Biometrics:     Pre Biometrics - 03/29/17 1440      Pre Biometrics   Height _0  (1.753 m)   Weight 173 lb 8 oz (78.7 kg)   Waist Circumference 39.75 inches   Hip Circumference 39.5 inches   Waist to Hip Ratio 1.01 %   BMI (Calculated) 25.7   Single Leg Stand 16.57 seconds         Post Biometrics - 05/24/17 1704       Post  Biometrics   Height _1  (1.753 m)   Weight 172 lb (78 kg)   Waist Circumference 37.5 inches   Hip Circumference 39 inches   Waist to Hip Ratio 0.96 %   BMI (Calculated) 25.39   Single Leg Stand 21.31 seconds      Nutrition Therapy Plan and Nutrition Goals:     Nutrition Therapy & Goals - 05/25/17 1252      Nutrition Therapy   RD appointment defered Yes      Nutrition Discharge: Rate Your Plate Scores:     Nutrition Assessments - 05/29/17 1300      MEDFICTS Scores   Post Score 4      Nutrition Goals Re-Evaluation:     Nutrition Goals Re-Evaluation    RoKimballtoname 04/26/17 1652 05/25/17 1248           Goals   Comment rd appoinment deferred.  Has made healthy changes and is doing well with the changes. About ready to graduate from Cardiac Rehab. Cardiac Rehab REgistered Dietician appt deferred still      Expected Outcome Will let staff know if he changes his mind  to meet with the RD Heart healthy eating         Nutrition Goals Discharge (Final Nutrition Goals Re-Evaluation):     Nutrition Goals Re-Evaluation - 05/25/17 1248      Goals   Comment About ready to graduate from Cardiac Rehab. Cardiac Rehab REgistered Dietician appt deferred still   Expected Outcome Heart healthy eating      Psychosocial: Target Goals: Acknowledge presence or absence of significant depression and/or stress, maximize coping skills, provide positive support system. Participant is able to verbalize types and ability to use techniques and skills needed for reducing stress and depression.  Initial Review & Psychosocial Screening:     Initial Psych Review & Screening - 03/29/17 1439      Initial Review   Current issues with None Identified     Family Dynamics   Good Support System? Yes  Wife     Barriers   Psychosocial barriers to participate in program There are no identifiable barriers or psychosocial needs.     Screening Interventions   Interventions Encouraged to exercise;Program counselor consult      Quality of Life Scores:      Quality of Life - 05/29/17 1258      Quality of Life Scores   Health/Function Post 26.8 %   Socioeconomic Post 27.43 %   Psych/Spiritual Post 29.14 %   Family Post 26.4 %   GLOBAL Post 27.35 %      PHQ-9: Recent Review Flowsheet Data    Depression screen [facility] 2/9 03/29/2017   Decreased Interest 0   Down, Depressed, Hopeless 0   PHQ - 2 Score 0   Altered sleeping 1   Tired, decreased energy 1   Change in appetite 0   Feeling bad or failure about yourself  0   Trouble concentrating 0   Moving slowly or fidgety/restless 0   Suicidal thoughts 0   PHQ-9 Score 2   Difficult doing work/chores Not difficult at all     Interpretation of Total Score  Total Score Depression Severity:  1-4 = Minimal depression, 5-9 = Mild depression, 10-14 = Moderate depression, 15-19 = Moderately severe depression, 20-27 = Severe  depression   Psychosocial Evaluation and Intervention:     Psychosocial Evaluation - 04/04/17 1717      Psychosocial Evaluation & Interventions   Interventions Encouraged to exercise with the program and follow exercise prescription;Stress management education;Relaxation education   Comments Counselor met with Mr. Ransom Collier Salina) today for initial psychosocial evaluation.  He is a 66 year old who had open heart surgery recently with a CABGx5.  Collier Salina has a strong support system with a spouse of 13 years and several adult daughters locally; as well as active involvement in his local church.  Collier Salina reports having some chronic intermittent sleep problems over the past (3) years and takes an OTC sleep aid to help.  He reports recently this has improved.  He has a good appetite as well.  Collier Salina denies a history of depression or anxiety and reports he is typically in a positive mood.  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  He has goals to increase his stamina and strength and possibly lose some weight while in this program.  Counselor encouraged Collier Salina to exercise consistently for his sleep issues as well as his stress issues to assist with coping.  Staff will follow with Collier Salina throughout the course of this program.    Expected Outcomes Collier Salina will benefit from consistent exercise to achieve his stated goals.  He also will benefit from participating in the educational components of this program and psychoeducational to learn more about coping strategies and relaxation to help with his stress management.  Collier Salina will meet with the dietician to address his weight loss goals.     Continue Psychosocial Services  Follow up required by staff      Psychosocial Re-Evaluation:     Psychosocial Re-Evaluation    [place] Name 04/26/17 1634 05/25/17 1249           Psychosocial  Re-Evaluation   Current issues with None Identified  -       Comments Laurey Arrow has had no changes regarding stress since he talked with Juliann Pulse.  He was reminded to talk to the staff if any concerns happen. About ready to graduate from Cardiac REhab. 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      Expected Outcomes Short and long term: Continue without stress and be ware to report any concerns Cont to keep his stress levels low.       Continue Psychosocial Services  Follow up required by staff  -         Psychosocial Discharge (Final Psychosocial Re-Evaluation):     Psychosocial Re-Evaluation - 05/25/17 1249      Psychosocial Re-Evaluation   Comments About ready to graduate from Cardiac REhab. 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   Expected Outcomes Cont to keep his stress levels low.       Vocational Rehabilitation: Provide vocational rehab assistance to qualifying candidates.   Vocational Rehab Evaluation & Intervention:     Vocational Rehab - 03/29/17 1440      Initial Vocational Rehab Evaluation & Intervention   Assessment shows need for Vocational Rehabilitation No      Education: Education Goals: Education classes will be provided on a variety of topics geared toward better understanding of heart health and risk factor modification. Participant will state understanding/return demonstration of topics presented as noted by education test scores.  Learning Barriers/Preferences:     Learning Barriers/Preferences - 03/29/17 1440      Learning Barriers/Preferences   Learning Barriers None   Learning Preferences Individual Instruction      Education Topics: General Nutrition Guidelines/Fats and Fiber: -Group instruction provided by verbal, written material, models and posters to present the general guidelines for heart healthy nutrition. Gives an explanation and review of dietary fats and fiber.   Cardiac Rehab from 05/30/2017 in  [facility] Cardiac and Pulmonary Rehab  Date  05/07/17  Educator  PI      Controlling Sodium/Reading Food Labels: -Group verbal and written material supporting the discussion of sodium use in heart healthy nutrition. Review and explanation with models, verbal and written materials for utilization of the food label.   Cardiac Rehab from 05/30/2017 in [facility] Cardiac and Pulmonary Rehab  Date  05/14/17  Educator  PI  Instruction Review Code  1- Verbalizes Understanding      Exercise Physiology & Risk Factors: - Group verbal and written instruction with models to review the exercise physiology of the cardiovascular system and associated critical values. Details cardiovascular disease risk factors and the goals associated with each risk factor.   Cardiac Rehab from 05/30/2017 in [facility] Cardiac and Pulmonary Rehab  Date  05/23/17  Educator  Nada Maclachlan, EP  Instruction Review Code  2- Demonstrated Understanding      Aerobic Exercise & Resistance Training: - Gives group verbal and written discussion on the health impact of inactivity. On the components of aerobic and resistive training programs and the benefits of this training and how to safely progress through these programs.   Cardiac Rehab from 05/30/2017 in [facility] Cardiac and Pulmonary Rehab  Date  05/28/17  Educator  [facility]  Instruction Review Code  1- Geologist, engineering, Balance, General Exercise Guidelines: - Provides group verbal and written instruction on the benefits of flexibility and  balance training programs. Provides general exercise guidelines with specific guidelines to those with heart or lung disease. Demonstration and skill practice provided.   Cardiac Rehab from 05/30/2017 in [facility] Cardiac and Pulmonary Rehab  Date  05/30/17  Educator  AS  Instruction Review Code  1- Verbalizes Understanding      Stress Management: - Provides group verbal and written instruction about the health risks of elevated stress,  cause of high stress, and healthy ways to reduce stress.   Cardiac Rehab from 05/30/2017 in [facility] Cardiac and Pulmonary Rehab  Date  04/11/17  Educator  [place]      Depression: - Provides group verbal and written instruction on the correlation between heart/lung disease and depressed mood, treatment options, and the stigmas associated with seeking treatment.   Cardiac Rehab from 05/30/2017 in [facility] Cardiac and Pulmonary Rehab  Date  05/09/17  Educator  [facility]  Instruction Review Code (retired)  2- meets Designer, fashion/clothing & Physiology of the Heart: - Group verbal and written instruction and models provide basic cardiac anatomy and physiology, with the coronary electrical and arterial systems. Review of: AMI, Angina, Valve disease, Heart Failure, Cardiac Arrhythmia, Pacemakers, and the ICD.   Cardiac Rehab from 05/30/2017 in [facility] Cardiac and Pulmonary Rehab  Date  04/09/17  Educator  [facility]      Cardiac Procedures: - Group verbal and written instruction to review commonly prescribed medications for heart disease. Reviews the medication, class of the drug, and side effects. Includes the steps to properly store meds and maintain the prescription regimen. (beta blockers and nitrates)   Cardiac Rehab from 05/30/2017 in [facility] Cardiac and Pulmonary Rehab  Date  04/16/17  Educator  [facility]      Cardiac Medications I: - Group verbal and written instruction to review commonly prescribed medications for heart disease. Reviews the medication, class of the drug, and side effects. Includes the steps to properly store meds and maintain the prescription regimen.   Cardiac Rehab from 05/30/2017 in [facility] Cardiac and Pulmonary Rehab  Date  04/23/17 [Part 2 04/25/17]  Educator  MJA , RN [part 2 SB]      Cardiac Medications II: -Group verbal and written instruction to review commonly prescribed medications for heart disease. Reviews the medication, class of the drug, and side effects. (all other drug  classes)    Go Sex-Intimacy & Heart Disease, Get SMART - Goal Setting: - Group verbal and written instruction through game format to discuss heart disease and the return to sexual intimacy. Provides group verbal and written material to discuss and apply goal setting through the application of the S.M.A.R.T. Method.   Cardiac Rehab from 05/30/2017 in [place] [facility] Cardiac and Pulmonary Rehab  Date  04/16/17  Educator  [facility]      Other Matters of the Heart: - Provides group verbal, written materials and models to describe Heart Failure, Angina, Valve Disease, Peripheral Artery Disease, and Diabetes in the realm of heart disease. Includes description of the disease process and treatment options available to the cardiac patient.   Cardiac Rehab from 05/30/2017 in [facility] Cardiac and Pulmonary Rehab  Date  04/09/17  Educator  [facility]      Exercise & Equipment Safety: - Individual verbal instruction and demonstration of equipment use and safety with use of the equipment.   Cardiac Rehab from 05/30/2017 in [facility] Cardiac and Pulmonary Rehab  Date  03/29/17  Educator  [facility]      Infection Prevention: - Provides verbal and  written material to individual with discussion of infection control including proper hand washing and proper equipment cleaning during exercise session.   Cardiac Rehab from 05/30/2017 in [facility] Cardiac and Pulmonary Rehab  Date  03/29/17  Educator  [facility]      Falls Prevention: - Provides verbal and written material to individual with discussion of falls prevention and safety.   Cardiac Rehab from 05/30/2017 in [facility] Cardiac and Pulmonary Rehab  Date  03/29/17  Educator  [facility]  Instruction Review Code (retired)  2- meets goals/outcomes      Diabetes: - Individual verbal and written instruction to review signs/symptoms of diabetes, desired ranges of glucose level fasting, after meals and with exercise. Acknowledge that pre and post exercise glucose checks will be done for 3 sessions at entry of  program.   Cardiac Rehab from 05/30/2017 in [facility] Cardiac and Pulmonary Rehab  Date  03/29/17  Educator  [place]      Other: -Provides group and verbal instruction on various topics (see comments)    Knowledge Questionnaire Score:     Knowledge Questionnaire Score - 05/29/17 1257      Knowledge Questionnaire Score   Post Score 27/28      Core Components/Risk Factors/Patient Goals at Admission:     Personal Goals and Risk Factors at Admission - 03/29/17 1438      Core Components/Risk Factors/Patient Goals on Admission   Lipids Yes   Intervention Provide education and support for participant on nutrition & aerobic/resistive exercise along with prescribed medications to achieve LDL <52m, HDL >458m   Expected Outcomes Short Term: Participant states understanding of desired cholesterol values and is compliant with medications prescribed. Participant is following exercise prescription and nutrition guidelines.;Long Term: Cholesterol controlled with medications as prescribed, with individualized exercise RX and with personalized nutrition plan. Value goals: LDL < 7072mHDL > 40 mg.   Stress Yes   Intervention Offer individual and/or small group education and counseling on adjustment to heart disease, stress management and health-related lifestyle change. Teach and support self-help strategies.;Refer participants experiencing significant psychosocial distress to appropriate mental health specialists for further evaluation and treatment. When possible, include family members and significant others in education/counseling sessions.   Expected Outcomes Short Term: Participant demonstrates changes in health-related behavior, relaxation and other stress management skills, ability to obtain effective social support, and compliance with psychotropic medications if prescribed.;Long Term: Emotional wellbeing is indicated by absence of clinically significant psychosocial distress or social isolation.       Core Components/Risk Factors/Patient Goals Review:      Goals and Risk Factor Review    Row Name 04/26/17 1647 05/25/17 1249           Core Components/Risk Factors/Patient Goals Review   Personal Goals Review Lipids  -      Review Spoke with PetLaurey Arrowout his cholesterol, he verbalized understanding about keeping his risk factor under control by being compliant with his meds, good nutrition and exercise.   will follow up with his MD about his lipids      Expected Outcomes Short and long term   Adherence with meds, nutrition and exercise for great risk factor control Heart healthy living         Core Components/Risk Factors/Patient Goals at Discharge (Final Review):      Goals and Risk Factor Review - 05/25/17 1249      Core Components/Risk Factors/Patient Goals Review   Review will follow up with his MD about his lipids   Expected Outcomes  Heart healthy living      ITP Comments:     ITP Comments    Row Name 03/29/17 1436 04/04/17 0716 05/02/17 0640 05/30/17 1138     ITP Comments Med review completed. Initial ITP created. Diagnosis can be found in [facility] 6/13 30 day review. Continue with ITP unless directed changes per Medical Director review    New to Program 30 day review. Continue with ITP unless directed changes per Medical Director review  30 day review. Continue with ITP unless directed changes per Medical Director review.         Comments: discharge ITP

## 2017-05-31 NOTE — Progress Notes (Signed)
Discharge Progress Report  Patient Details  Name: Jordan Wong MRN: [id number] Date of Birth: Apr 15, 1951 Referring Provider:     Cardiac Rehab from 03/29/2017 in [facility] Cardiac and Pulmonary Rehab  Referring Provider  Gollan       Number of Visits: 36/36  Reason for Discharge:  Patient reached a stable level of exercise. Patient independent in their exercise. Patient has met program and personal goals.  Smoking History:  History  Smoking Status  . Never Smoker  Smokeless Tobacco  . Never Used    Diagnosis:  S/P CABG x 5  ADL UCSD:   Initial Exercise Prescription:     Initial Exercise Prescription - 03/29/17 1400      Date of Initial Exercise RX and Referring Provider   Date 03/29/17   Referring Provider Gollan     Treadmill   MPH 2.5   Grade 1   Minutes 15   METs 3.35     Recumbant Bike   Level 3   RPM 60   Watts 34   Minutes 15   METs 3.35     T5 Nustep   Level 3   SPM 80   Minutes 15   METs 3      Discharge Exercise Prescription (Final Exercise Prescription Changes):     Exercise Prescription Changes - 05/24/17 1100      Response to Exercise   Blood Pressure (Admit) 118/60   Blood Pressure (Exercise) 128/66   Blood Pressure (Exit) 118/60   Heart Rate (Admit) 58 bpm   Heart Rate (Exercise) 131 bpm   Heart Rate (Exit) 72 bpm   Rating of Perceived Exertion (Exercise) 14   Symptoms none   Duration Continue with 45 min of aerobic exercise without signs/symptoms of physical distress.   Intensity THRR unchanged     Progression   Progression Continue to progress workloads to maintain intensity without signs/symptoms of physical distress.   Average METs 6.22     Resistance Training   Weight 3   Reps 10-15     Interval Training   Interval Training Yes     Recumbant Bike   Level 3   Watts 71   Minutes 15   METs 5.15     T5 Nustep   Level 3   Minutes 15   METs 7.5      Functional Capacity:     6 Minute Walk    Row Name  03/29/17 1441 05/24/17 1702       6 Minute Walk   Phase Initial Discharge    Distance 1320 feet 2142 feet    Distance % Change  - 62 %    Distance Feet Change  - 822 ft    Walk Time 6 minutes 6 minutes    # of Rest Breaks 0 0    MPH 2.5 4.05    METS 3.35 5.32    RPE 13 15    VO2 Peak 11.7 18.65    Symptoms No No    Resting HR 67 bpm 63 bpm    Resting BP 106/68 110/64    Resting Oxygen Saturation   - 98 %    Exercise Oxygen Saturation  during 6 min walk  - 98 %    Max Ex. HR 100 bpm 110 bpm    Max Ex. BP 130/70 184/76    2 Minute Post BP 108/60  -       Psychological, QOL, Others - Outcomes: PHQ 2/9: Depression screen PHQ  2/9 03/29/2017  Decreased Interest 0  Down, Depressed, Hopeless 0  PHQ - 2 Score 0  Altered sleeping 1  Tired, decreased energy 1  Change in appetite 0  Feeling bad or failure about yourself  0  Trouble concentrating 0  Moving slowly or fidgety/restless 0  Suicidal thoughts 0  PHQ-9 Score 2  Difficult doing work/chores Not difficult at all    Quality of Life:     Quality of Life - 05/29/17 1258      Quality of Life Scores   Health/Function Post 26.8 %   Socioeconomic Post 27.43 %   Psych/Spiritual Post 29.14 %   Family Post 26.4 %   GLOBAL Post 27.35 %      Personal Goals: Goals established at orientation with interventions provided to work toward goal.     Personal Goals and Risk Factors at Admission - 03/29/17 1438      Core Components/Risk Factors/Patient Goals on Admission   Lipids Yes   Intervention Provide education and support for participant on nutrition & aerobic/resistive exercise along with prescribed medications to achieve LDL <55m, HDL >471m   Expected Outcomes Short Term: Participant states understanding of desired cholesterol values and is compliant with medications prescribed. Participant is following exercise prescription and nutrition guidelines.;Long Term: Cholesterol controlled with medications as prescribed, with  individualized exercise RX and with personalized nutrition plan. Value goals: LDL < 7060mHDL > 40 mg.   Stress Yes   Intervention Offer individual and/or small group education and counseling on adjustment to heart disease, stress management and health-related lifestyle change. Teach and support self-help strategies.;Refer participants experiencing significant psychosocial distress to appropriate mental health specialists for further evaluation and treatment. When possible, include family members and significant others in education/counseling sessions.   Expected Outcomes Short Term: Participant demonstrates changes in health-related behavior, relaxation and other stress management skills, ability to obtain effective social support, and compliance with psychotropic medications if prescribed.;Long Term: Emotional wellbeing is indicated by absence of clinically significant psychosocial distress or social isolation.       Personal Goals Discharge:     Goals and Risk Factor Review    Row Name 04/26/17 1647 05/25/17 1249           Core Components/Risk Factors/Patient Goals Review   Personal Goals Review Lipids  -      Review Spoke with PetLaurey Arrowout his cholesterol, he verbalized understanding about keeping his risk factor under control by being compliant with his meds, good nutrition and exercise.   will follow up with his MD about his lipids      Expected Outcomes Short and long term   Adherence with meds, nutrition and exercise for great risk factor control Heart healthy living         Exercise Goals and Review:     Exercise Goals    Row Name 03/29/17 1441             Exercise Goals   Increase Physical Activity Yes       Intervention Provide advice, education, support and counseling about physical activity/exercise needs.;Develop an individualized exercise prescription for aerobic and resistive training based on initial evaluation findings, risk stratification, comorbidities and  participant's personal goals.       Expected Outcomes Achievement of increased cardiorespiratory fitness and enhanced flexibility, muscular endurance and strength shown through measurements of functional capacity and personal statement of participant.       Increase Strength and Stamina Yes  Intervention Develop an individualized exercise prescription for aerobic and resistive training based on initial evaluation findings, risk stratification, comorbidities and participant's personal goals.;Provide advice, education, support and counseling about physical activity/exercise needs.       Expected Outcomes Achievement of increased cardiorespiratory fitness and enhanced flexibility, muscular endurance and strength shown through measurements of functional capacity and personal statement of participant.          Nutrition & Weight - Outcomes:     Pre Biometrics - 03/29/17 1440      Pre Biometrics   Height _0  (1.753 m)   Weight 173 lb 8 oz (78.7 kg)   Waist Circumference 39.75 inches   Hip Circumference 39.5 inches   Waist to Hip Ratio 1.01 %   BMI (Calculated) 25.7   Single Leg Stand 16.57 seconds         Post Biometrics - 05/24/17 1704       Post  Biometrics   Height _1  (1.753 m)   Weight 172 lb (78 kg)   Waist Circumference 37.5 inches   Hip Circumference 39 inches   Waist to Hip Ratio 0.96 %   BMI (Calculated) 25.39   Single Leg Stand 21.31 seconds      Nutrition:     Nutrition Therapy & Goals - 05/25/17 1252      Nutrition Therapy   RD appointment defered Yes      Nutrition Discharge:     Nutrition Assessments - 05/29/17 1300      MEDFICTS Scores   Post Score 4      Education Questionnaire Score:     Knowledge Questionnaire Score - 05/29/17 1257      Knowledge Questionnaire Score   Post Score 27/28      Goals reviewed with patient; copy given to patient.

## 2017-05-31 NOTE — Progress Notes (Signed)
Daily Session Note  Patient Details  Name: Jordan Wong MRN: [id number] Date of Birth: 19-Feb-1951 Referring Provider:     Cardiac Rehab from 03/29/2017 in [facility] Cardiac and Pulmonary Rehab  Referring Provider  Gollan      Encounter Date: 05/31/2017  Check In:     Session Check In - 05/31/17 1710      Check-In   Location [facility]-Cardiac & Pulmonary Rehab   Staff Present Nada Maclachlan, BA, ACSM CEP, Exercise Physiologist;Ayani Ospina Amedeo Plenty, BS, ACSM CEP, Exercise Physiologist;[place] physician immediately available to respond to emergencies See telemetry face sheet for immediately available ER MD   Medication changes reported     No   Fall or balance concerns reported    No   Warm-up and Cool-down Performed on first and last piece of equipment   Resistance Training Performed Yes   VAD Patient? No     Pain Assessment   Currently in Pain? No/denies   Multiple Pain Sites No         History  Smoking Status  . Never Smoker  Smokeless Tobacco  . Never Used    Goals Met:  Independence with exercise equipment Exercise tolerated well Personal goals reviewed No report of cardiac concerns or symptoms Strength training completed today  Goals Unmet:  Not Applicable  Comments:  Jordan Wong graduated today from cardiac rehab with 36 sessions completed.  Details of the patient's exercise prescription and what he needs to do in order to continue the prescription and progress were discussed with patient.  Patient was given a copy of prescription and goals.  Patient verbalized understanding.  Jordan Wong plans to continue to exercise by joining a community fitness center.   Dr. Emily Wong is Medical Director for [facility].

## 2017-06-03 ENCOUNTER — Other Ambulatory Visit: Payer: Self-pay | Admitting: Physician Assistant

## 2017-06-03 DIAGNOSIS — I2510 Atherosclerotic heart disease of native coronary artery without angina pectoris: Secondary | ICD-10-CM

## 2017-06-05 ENCOUNTER — Other Ambulatory Visit: Payer: Self-pay | Admitting: Cardiovascular Disease

## 2017-06-05 DIAGNOSIS — E782 Mixed hyperlipidemia: Secondary | ICD-10-CM

## 2017-06-05 NOTE — Telephone Encounter (Signed)
Refill Request for Atorvastatin 40 mg tablet. Please advise if ok to refill.

## 2017-06-09 NOTE — Progress Notes (Signed)
Cardiology Office Note  Date:  06/11/2017   ID:  Jordan Wong, DOB 12-Nov-1950, MRN [id number]  PCP:  Jerl Mina, MD   Chief Complaint  Patient presents with  . other    3 month follow up. Meds reviewed by the pt. verbally. "doing well."     HPI:  66 year old male with a history of  hepatitis C,  gout,  hyperlipidemia, CAD,  status post CABG. EF 55% Who presents for follow up of his CAD, CABG   02/2017 MV:  septal, apical, dist antsept ischemia, EF 50%, 1mm ST dep w/ exercise;   02/2017 Cath:  LM nl, LAD 99p, 50d, D1 90ost, RI 95ost, LCX 15m/d, RCA 95p, EF 55-65%;   CABG x 5: 02/2017  LIMA->LAD, free RIMA->RCA, VG->Diag, VG->RI, VG->OM.  In follow-up today he reports feeling well, no complaints He completed cardiac rehabilitation, shortness of breath improving Now going to start Silver sneakers Denies any significant chest pain  Previous hospitalization reviewed with him,  Unstable angina leading to positive stress test, admission to the hospital  NSTEMI  catheterization Transferred to the hospital for bypass surgery  EKG personally reviewed by myself on todays visit Shows normal sinus rhythm rate 69 bpm left axis deviation   PMH:   has a past medical history of CAD (coronary artery disease); Hepatitis C (2016); History of blood transfusion (1987); History of echocardiogram; History of gout; HLD (hyperlipidemia); Hyperglycemia; and Pneumonia (2000s X 1).  PSH:    Past Surgical History:  Procedure Laterality Date  . CARDIAC CATHETERIZATION  02/22/2017  . CORONARY ARTERY BYPASS GRAFT N/A 02/23/2017   Procedure: CORONARY ARTERY BYPASS GRAFT times five using Bilateral Mammary arteries and Right leg saphenous vein;  Surgeon: Alleen Borne, MD;  Location: [facility] OR;  Service: Open Heart Surgery;  Laterality: N/A;  . EYE SURGERY    . FEMUR FRACTURE SURGERY Left 1987; 1988   "1st one didn't heal right; had to go in & put more bone i it"  . INGUINAL HERNIA REPAIR Bilateral  1970s-1990s  . LEFT HEART CATH AND CORONARY ANGIOGRAPHY N/A 02/22/2017   Procedure: Left Heart Cath and Coronary Angiography;  Surgeon: Iran Ouch, MD;  Location: [facility] INVASIVE CV LAB;  Service: Cardiovascular;  Laterality: N/A;  . RETINAL DETACHMENT SURGERY Right   . TEE WITHOUT CARDIOVERSION N/A 02/23/2017   Procedure: TRANSESOPHAGEAL ECHOCARDIOGRAM (TEE);  Surgeon: Alleen Borne, MD;  Location: [facility] OR;  Service: Open Heart Surgery;  Laterality: N/A;    Current Outpatient Prescriptions  Medication Sig Dispense Refill  . acetaminophen (TYLENOL) 325 MG tablet Take 2 tablets (650 mg total) by mouth every 6 (six) hours as needed for mild pain (or Fever >/= 101).    Marland Kitchen aspirin EC 325 MG EC tablet Take 1 tablet (325 mg total) by mouth daily. 30 tablet 0  . atorvastatin (LIPITOR) 40 MG tablet TAKE 1 TABLET (40 MG TOTAL) BY MOUTH DAILY AT 6 PM. 90 tablet 3  . colchicine 0.6 MG tablet Take 0.6 mg by mouth daily as needed.   0  . Melatonin 3 MG TABS Take by mouth.    . metoprolol tartrate (LOPRESSOR) 25 MG tablet Take 0.5 tablets (12.5 mg total) by mouth 2 (two) times daily. 60 tablet 1   No current facility-administered medications for this visit.      Allergies:   Patient has no known allergies.   Social History:  The patient  reports that he has never smoked. He has never used smokeless tobacco.  He reports that he does not drink alcohol or use drugs.   Family History:   family history includes Alcohol abuse in his mother; Cirrhosis in his mother; Psychiatric Illness in his father.    Review of Systems: Review of Systems  Constitutional: Negative.   Respiratory: Negative.   Cardiovascular: Negative.   Gastrointestinal: Negative.   Musculoskeletal: Negative.   Neurological: Negative.   Psychiatric/Behavioral: Negative.   All other systems reviewed and are negative.    PHYSICAL EXAM: VS:  BP 100/78 (BP Location: Left Arm, Patient Position: Sitting, Cuff Size: Normal)   Pulse 68    Ht  (1.727 m)   Wt 171 lb 12 oz (77.9 kg)   BMI 26.11 kg/m  , BMI Body mass index is 26.11 kg/m. GEN: Well nourished, well developed, in no acute distress  HEENT: normal  Neck: no JVD, carotid bruits, or masses Cardiac: RRR; no murmurs, rubs, or gallops,no edema  Respiratory:  clear to auscultation bilaterally, normal work of breathing GI: soft, nontender, nondistended, + BS MS: no deformity or atrophy  Skin: warm and dry, no rash Neuro:  Strength and sensation are intact Psych: euthymic mood, full affect    Recent Labs: 02/21/2017: ALT 20 02/24/2017: Magnesium 2.1 02/25/2017: BUN 10; Creatinine, Ser 0.86; Hemoglobin 10.0; Platelets 100; Potassium 4.3; Sodium 133    Lipid Panel Lab Results  Component Value Date   CHOL 175 02/21/2017   HDL 38 (L) 02/21/2017   LDLCALC 116 (H) 02/21/2017   TRIG 103 02/21/2017      Wt Readings from Last 3 Encounters:  06/11/17 171 lb 12 oz (77.9 kg)  05/24/17 172 lb (78 kg)  04/02/17 173 lb (78.5 kg)       ASSESSMENT AND PLAN:  Coronary artery disease of native artery of native heart with stable angina pectoris (HCC) Severe three-vessel disease Recommended he decrease the aspirin down to 81 mg daily, we will add Plavix 75 mg daily for one year  Recommended he continue his exercise, he has completed cardiac rehabilitation  Mixed hyperlipidemia Order placed for liver and lipid Goal LDL less than 70, preferably 50  S/P CABG x 5 Medication changes as above  Disposition:   F/U  12 months   Total encounter time more than 25 minutes  Greater than 50% was spent in counseling and coordination of care with the patient   No orders of the defined types were placed in this encounter.    Signed, Dossie Arbour, M.D., Ph.D. 06/11/2017  [facility] [place], [place] [phone]

## 2017-06-11 ENCOUNTER — Ambulatory Visit (INDEPENDENT_AMBULATORY_CARE_PROVIDER_SITE_OTHER): Payer: Medicare HMO | Admitting: Cardiovascular Disease

## 2017-06-11 ENCOUNTER — Encounter: Payer: Self-pay | Admitting: Cardiovascular Disease

## 2017-06-11 VITALS — BP 100/78 | HR 68 | Ht 68.0 in | Wt 171.8 lb

## 2017-06-11 DIAGNOSIS — I25118 Atherosclerotic heart disease of native coronary artery with other forms of angina pectoris: Secondary | ICD-10-CM

## 2017-06-11 DIAGNOSIS — Z951 Presence of aortocoronary bypass graft: Secondary | ICD-10-CM | POA: Diagnosis not present

## 2017-06-11 DIAGNOSIS — E782 Mixed hyperlipidemia: Secondary | ICD-10-CM | POA: Diagnosis not present

## 2017-06-11 MED ORDER — CLOPIDOGREL BISULFATE 75 MG PO TABS: 75.0000 mg | ORAL_TABLET | Freq: Every day | ORAL | 4 refills | Status: DC

## 2017-06-11 NOTE — Patient Instructions (Addendum)
Medication Instructions:   Decrease the aspirin down to 81 mg daily Start plavix one a day  Labwork:  Fasting liver and lipid panel  - Please go to the [facility]. You will check in at the front desk to the right as you walk into the atrium. Valet Parking is offered if needed. - You will need to be fasting. Do not eat or drink after midnight the morning of you lab work. - No appointment needed.  Testing/Procedures:  No further testing at this time   Follow-Up: It was a pleasure seeing you in the office today. Please call us if you have new issues that need to be addressed before your next appt.  [phone]  Your physician wants you to follow-up in: 12 months.  You will receive a reminder letter in the mail two months in advance. If you don't receive a letter, please call our office to schedule the follow-up appointment.  If you need a refill on your cardiac medications before your next appointment, please call your pharmacy.

## 2017-11-21 ENCOUNTER — Other Ambulatory Visit: Payer: Self-pay | Admitting: Surgery

## 2017-11-21 DIAGNOSIS — I2510 Atherosclerotic heart disease of native coronary artery without angina pectoris: Secondary | ICD-10-CM

## 2017-11-23 ENCOUNTER — Other Ambulatory Visit: Payer: Self-pay | Admitting: Cardiovascular Disease

## 2017-11-23 DIAGNOSIS — I2510 Atherosclerotic heart disease of native coronary artery without angina pectoris: Secondary | ICD-10-CM

## 2017-11-26 NOTE — Telephone Encounter (Signed)
Pharmacy requesting Metoprolol Tartrate 25MG  1 tablet BID. Per Dr. Windell HummingbirdGollan's last office visit: . metoprolol tartrate (LOPRESSOR) 25 MG tablet Take 0.5 tablets (12.5 mg total) by mouth 2 (two) times daily.   Please advise which dose patient should be taking.

## 2018-05-13 IMAGING — DX DG CHEST 1V PORT
1 series · 1 of 1 positions shown · non-contrast
Comparison: 02/20/2017.

CLINICAL DATA: Status post CABG.

EXAM:
PORTABLE CHEST 1 VIEW

[chest ap]
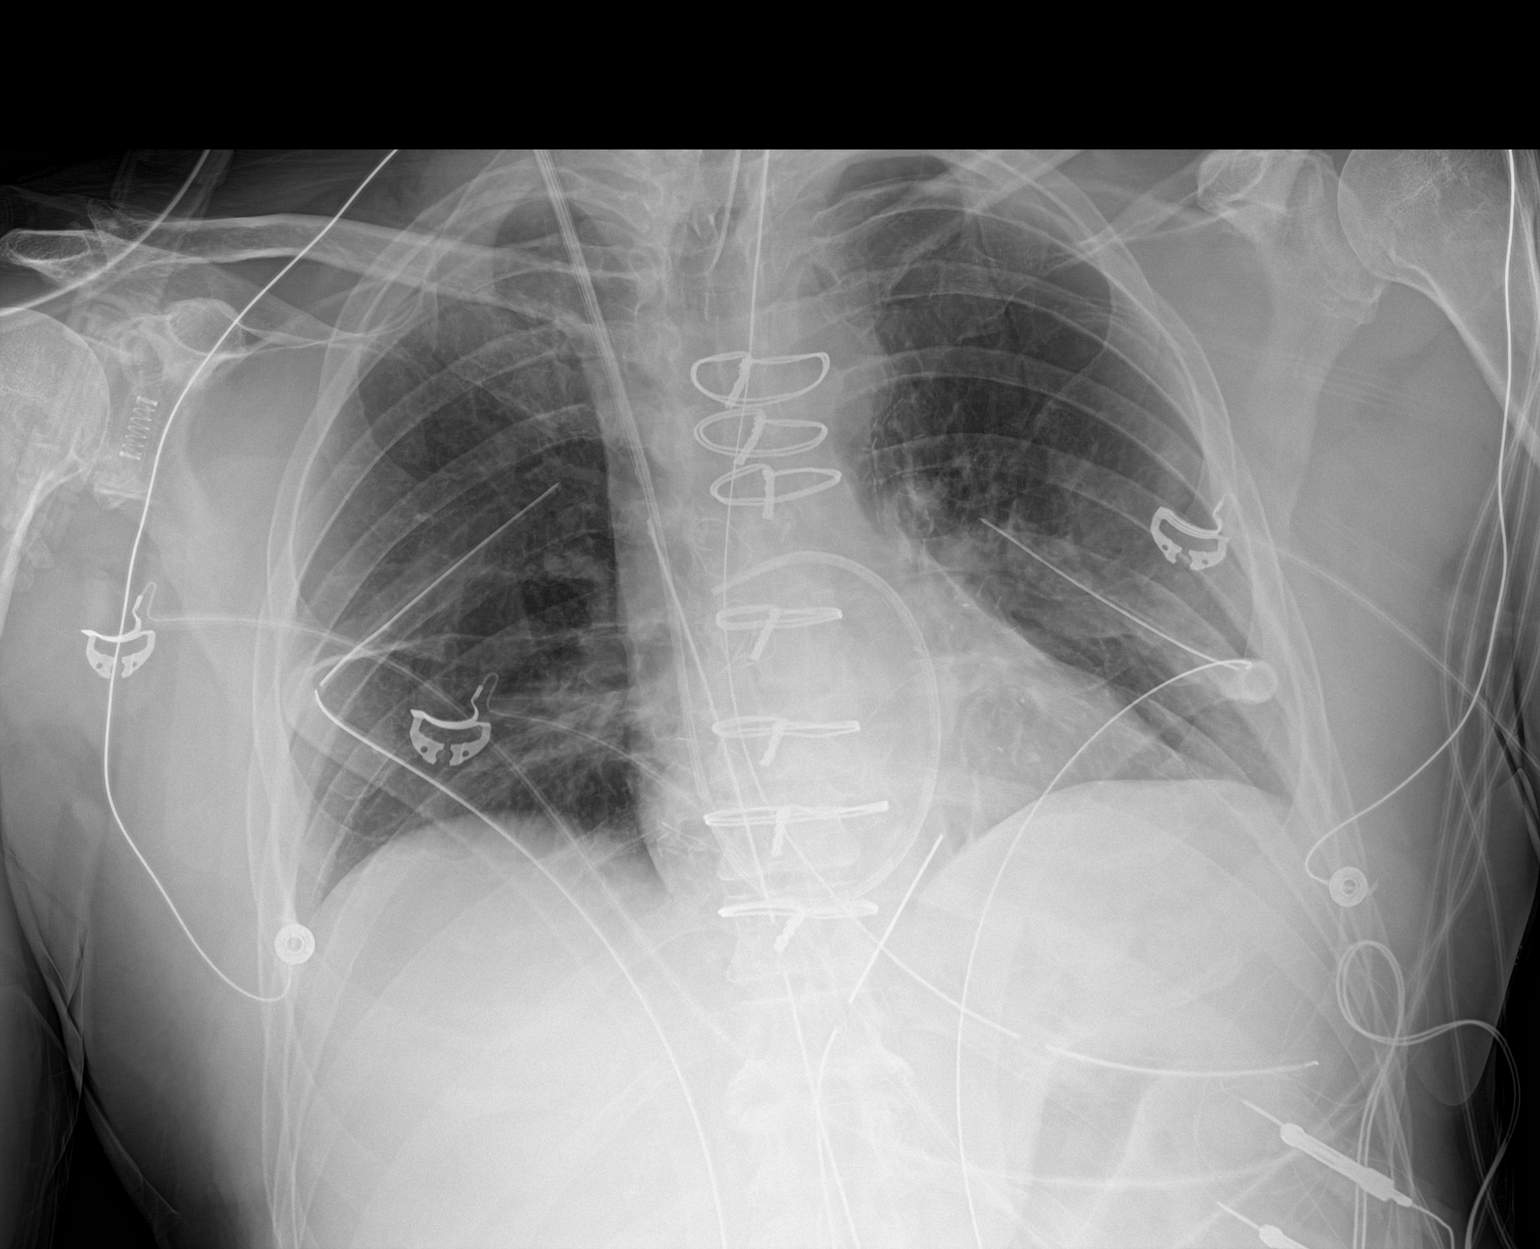

[1 of 1 positions shown; findings below may reference images not displayed]

FINDINGS: Normal sized heart. Interval post CABG changes with mediastinal and
bilateral chest tubes. There is an approximately 5% left apical
pneumothorax. Mild bibasilar atelectasis.

Endotracheal tube in satisfactory position. Right jugular Swan-Ganz
catheter tip in the right main pulmonary artery. Nasogastric tube
tip in the proximal stomach.
IMPRESSION: 1. Approximately 5% left apical pneumothorax.
2. Mild bibasilar atelectasis.
These results will be called to the ordering clinician or
representative by the Radiologist Assistant, and communication
documented in the PACS or zVision Dashboard.

## 2018-05-17 ENCOUNTER — Other Ambulatory Visit: Payer: Self-pay

## 2018-05-17 DIAGNOSIS — E782 Mixed hyperlipidemia: Secondary | ICD-10-CM

## 2018-05-17 NOTE — Telephone Encounter (Signed)
Flavia Shipper. Berge patient

## 2018-05-29 ENCOUNTER — Other Ambulatory Visit: Payer: Self-pay | Admitting: *Deleted

## 2018-05-29 DIAGNOSIS — E782 Mixed hyperlipidemia: Secondary | ICD-10-CM

## 2018-05-29 MED ORDER — ATORVASTATIN CALCIUM 40 MG PO TABS: 40.0000 mg | ORAL_TABLET | Freq: Every day | ORAL | 0 refills | Status: DC

## 2018-06-20 IMAGING — DX DG CHEST 2V
2 series · 2 of 2 positions shown · non-contrast
Comparison: 02/25/2017.

CLINICAL DATA: Recent CABG.

EXAM:
CHEST  2 VIEW

[dg chest 2 view (1 of 2)]
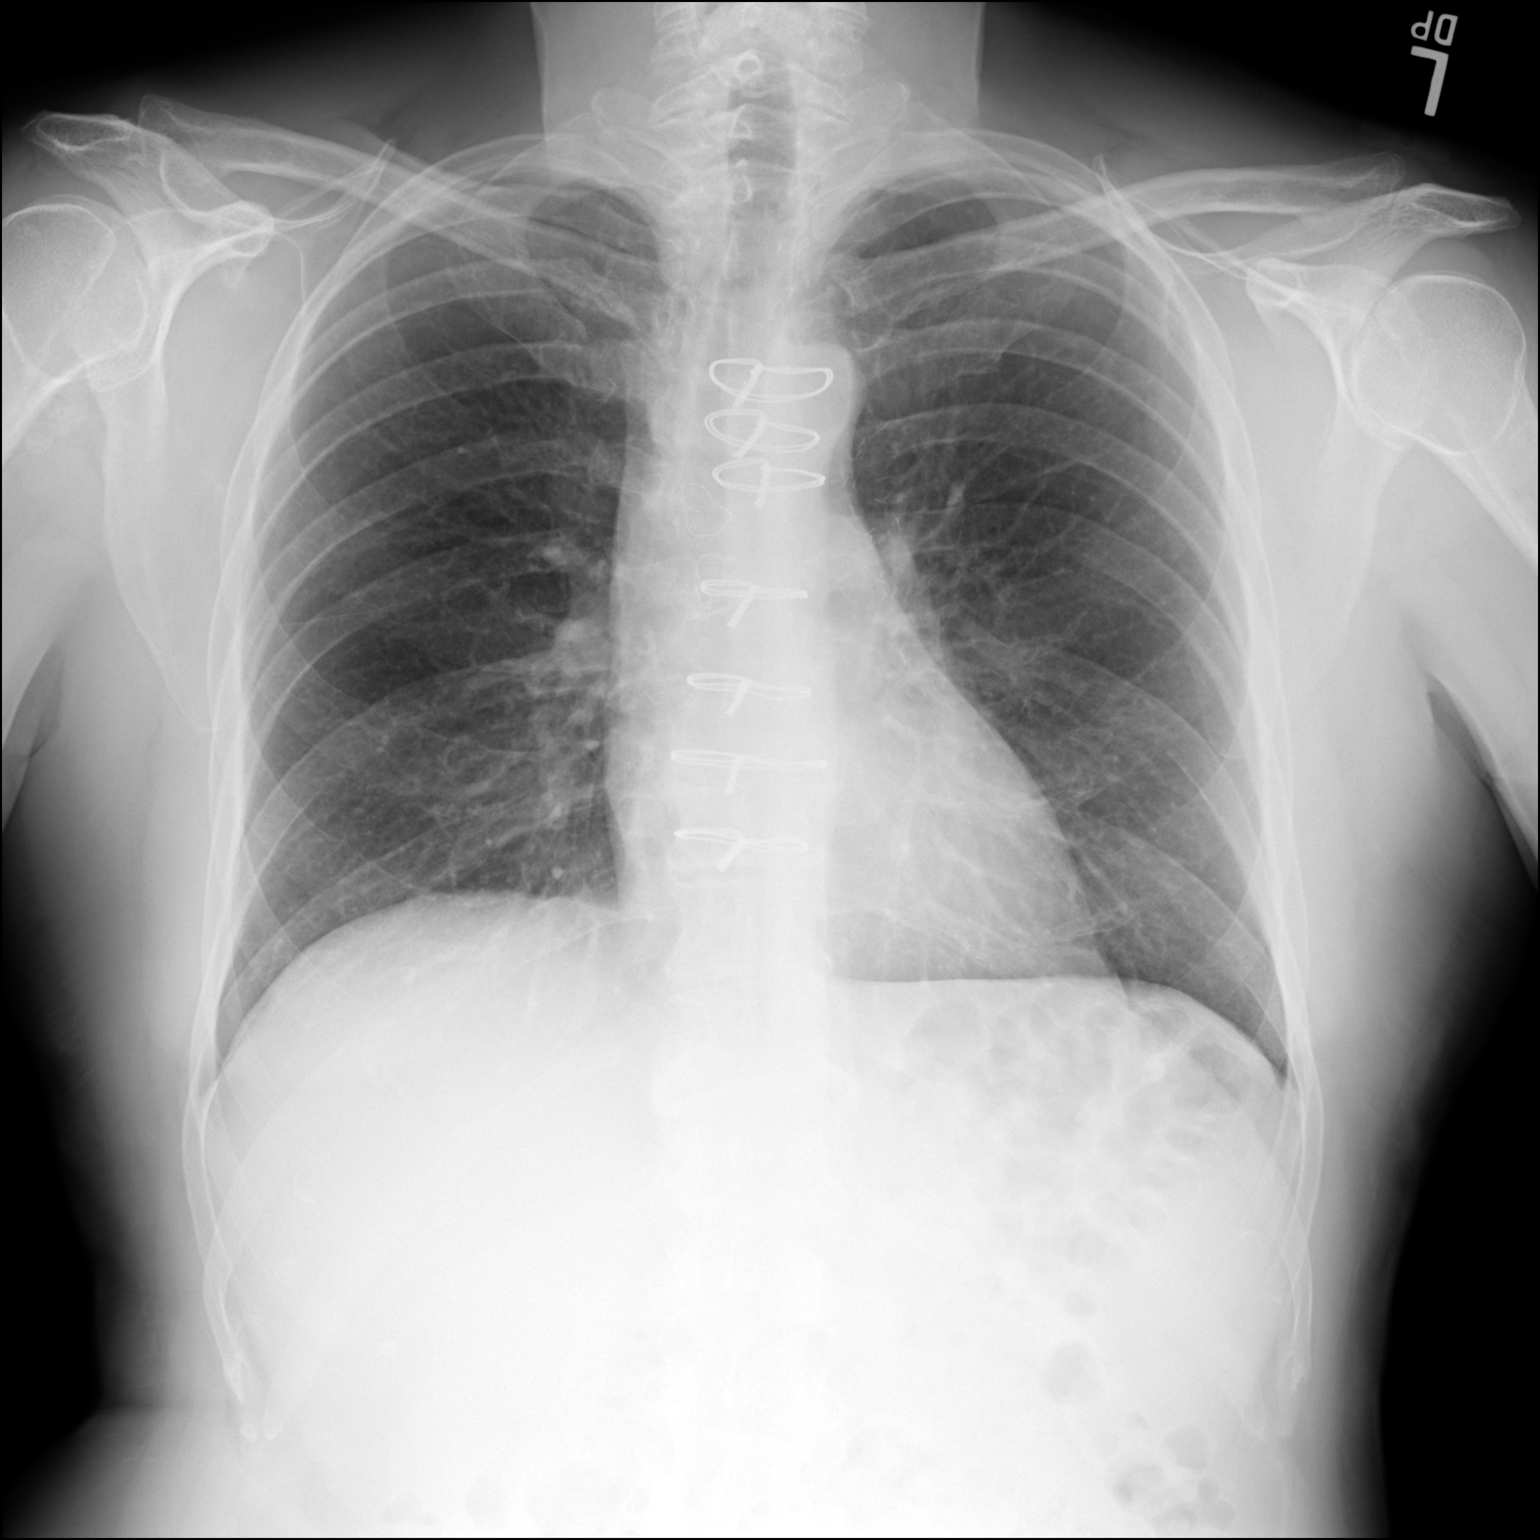

[dg chest 2 view (2 of 2)]
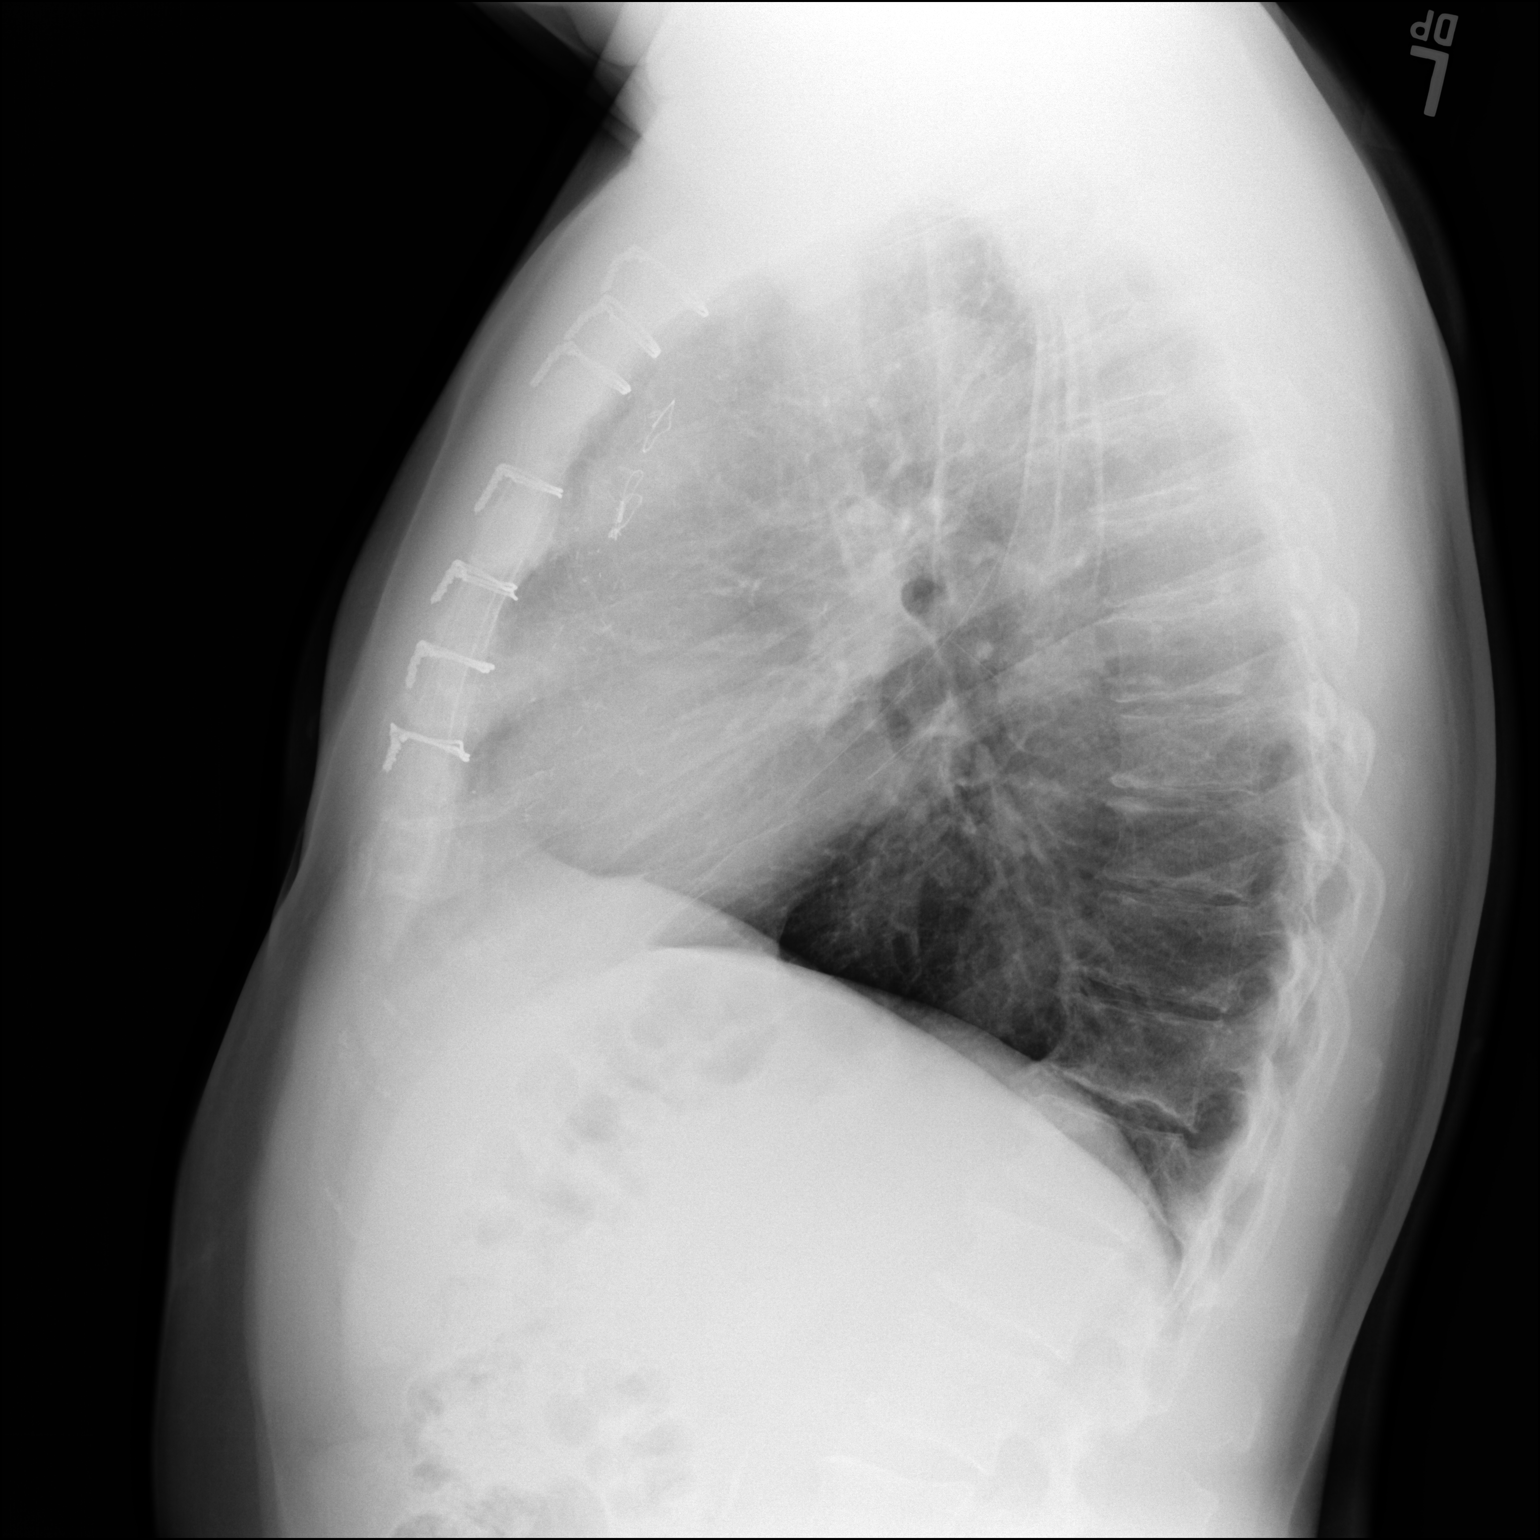

[2 of 2 positions shown; findings below may reference images not displayed]

FINDINGS: Normal cardiomediastinal silhouette. Clear lung fields. No bony
abnormality.
IMPRESSION: Marked improvement aeration.  No active disease.

## 2018-08-21 ENCOUNTER — Other Ambulatory Visit: Payer: Self-pay | Admitting: Cardiovascular Disease

## 2018-08-21 DIAGNOSIS — E782 Mixed hyperlipidemia: Secondary | ICD-10-CM

## 2018-08-21 DIAGNOSIS — I2510 Atherosclerotic heart disease of native coronary artery without angina pectoris: Secondary | ICD-10-CM

## 2018-08-29 ENCOUNTER — Telehealth: Payer: Self-pay

## 2018-08-29 NOTE — Telephone Encounter (Signed)
lmov to schedule appt  °

## 2018-08-29 NOTE — Telephone Encounter (Signed)
-----   Message from Festus AloeSharon G Crespo, CMA sent at 08/21/2018 10:26 AM EST ----- Please contact patient for a follow up appointment.  The patient was to be seen in Sept. 2019.  Thanks, Jasmine DecemberSharon  The patient is requesting refills on meds.

## 2018-09-03 ENCOUNTER — Other Ambulatory Visit: Payer: Self-pay | Admitting: Cardiovascular Disease

## 2018-09-03 NOTE — Telephone Encounter (Signed)
lmov to schedule appt  °

## 2018-09-21 ENCOUNTER — Other Ambulatory Visit: Payer: Self-pay | Admitting: Cardiovascular Disease

## 2018-09-21 DIAGNOSIS — E782 Mixed hyperlipidemia: Secondary | ICD-10-CM

## 2018-09-21 DIAGNOSIS — I2510 Atherosclerotic heart disease of native coronary artery without angina pectoris: Secondary | ICD-10-CM

## 2018-09-23 NOTE — Telephone Encounter (Signed)
Please contact pt for 12 month f/u pr requesting refills. Pt overdue last seen 05/2017.

## 2018-09-23 NOTE — Telephone Encounter (Signed)
Pt needs appointment scheduling awaiting for appointment before refills.

## 2018-09-24 NOTE — Telephone Encounter (Signed)
lmov to schedule appt  °

## 2018-09-25 ENCOUNTER — Other Ambulatory Visit: Payer: Self-pay | Admitting: Cardiovascular Disease

## 2018-09-25 DIAGNOSIS — E782 Mixed hyperlipidemia: Secondary | ICD-10-CM

## 2018-09-25 DIAGNOSIS — I2510 Atherosclerotic heart disease of native coronary artery without angina pectoris: Secondary | ICD-10-CM

## 2018-09-25 NOTE — Telephone Encounter (Signed)
°*  STAT* If patient is at the pharmacy, call can be transferred to refill team.   1. Which medications need to be refilled? (please list name of each medication and dose if known)  Atorvastatin 40 mg po q d   2. Which pharmacy/location (including street and city if local pharmacy) is medication to be sent to? [facility] [address]   3. Do they need a 30 day or 90 day supply? 90

## 2018-09-25 NOTE — Telephone Encounter (Signed)
Requested Prescriptions   Signed Prescriptions Disp Refills  . atorvastatin (LIPITOR) 40 MG tablet 90 tablet 0    Sig: TAKE 1 TABLET BY MOUTH EVERY DAY FOR 6PM    Authorizing Provider: Antonieta Iba    Ordering User: Margrett Rud  . metoprolol tartrate (LOPRESSOR) 25 MG tablet 90 tablet 0    Sig: TAKE 1/2 TABLET (12.5 MG TOTAL) BY MOUTH 2 (TWO) TIMES DAILY.    Authorizing Provider: Antonieta Iba    Ordering User: Margrett Rud

## 2018-09-25 NOTE — Telephone Encounter (Signed)
Requested Prescriptions   Signed Prescriptions Disp Refills  . atorvastatin (LIPITOR) 40 MG tablet 90 tablet 0    Sig: TAKE 1 TABLET BY MOUTH EVERY DAY FOR 6PM    Authorizing Provider: GOLLAN, TIMOTHY J    Ordering User: Graylyn Bunney N  . metoprolol tartrate (LOPRESSOR) 25 MG tablet 90 tablet 0    Sig: TAKE 1/2 TABLET (12.5 MG TOTAL) BY MOUTH 2 (TWO) TIMES DAILY.    Authorizing Provider: GOLLAN, TIMOTHY J    Ordering User: Jaylie Neaves N    

## 2018-09-25 NOTE — Telephone Encounter (Signed)
Please contact pt for future appointment pt OD 12 month f/u LS 05/2017. Pt needing refills.

## 2018-09-30 ENCOUNTER — Ambulatory Visit
Admission: RE | Admit: 2018-09-30 | Discharge: 2018-09-30 | Disposition: A | Discharge status: Home / Self Care | Payer: Medicare HMO | Source: Ambulatory Visit | Attending: Family Medicine | Admitting: Family Medicine

## 2018-09-30 ENCOUNTER — Other Ambulatory Visit: Payer: Self-pay | Admitting: Family Medicine

## 2018-09-30 ENCOUNTER — Other Ambulatory Visit: Payer: Self-pay | Admitting: Dietician

## 2018-09-30 DIAGNOSIS — M7989 Other specified soft tissue disorders: Secondary | ICD-10-CM

## 2018-10-10 ENCOUNTER — Other Ambulatory Visit: Payer: Self-pay | Admitting: Physical Medicine and Rehabilitation

## 2018-10-10 DIAGNOSIS — R200 Anesthesia of skin: Secondary | ICD-10-CM

## 2018-10-16 ENCOUNTER — Ambulatory Visit
Admission: RE | Admit: 2018-10-16 | Discharge: 2018-10-16 | Disposition: A | Discharge status: Home / Self Care | Payer: Medicare HMO | Source: Ambulatory Visit | Attending: Physical Medicine and Rehabilitation | Admitting: Physical Medicine and Rehabilitation

## 2018-10-16 DIAGNOSIS — R200 Anesthesia of skin: Secondary | ICD-10-CM

## 2018-11-04 ENCOUNTER — Ambulatory Visit: Payer: Medicare HMO | Admitting: Cardiovascular Disease

## 2018-11-04 ENCOUNTER — Encounter: Payer: Self-pay | Admitting: Cardiovascular Disease

## 2018-11-04 VITALS — BP 110/80 | HR 76 | Ht 68.0 in | Wt 175.5 lb

## 2018-11-04 DIAGNOSIS — E782 Mixed hyperlipidemia: Secondary | ICD-10-CM

## 2018-11-04 DIAGNOSIS — Z951 Presence of aortocoronary bypass graft: Secondary | ICD-10-CM | POA: Diagnosis not present

## 2018-11-04 DIAGNOSIS — E785 Hyperlipidemia, unspecified: Secondary | ICD-10-CM | POA: Diagnosis not present

## 2018-11-04 DIAGNOSIS — I25118 Atherosclerotic heart disease of native coronary artery with other forms of angina pectoris: Secondary | ICD-10-CM | POA: Diagnosis not present

## 2018-11-04 MED ORDER — METOPROLOL TARTRATE 25 MG PO TABS: ORAL_TABLET | ORAL | 3 refills | Status: DC

## 2018-11-04 MED ORDER — CLOPIDOGREL BISULFATE 75 MG PO TABS: 75.0000 mg | ORAL_TABLET | Freq: Every day | ORAL | 3 refills | Status: DC

## 2018-11-04 NOTE — Patient Instructions (Addendum)
Medication Instructions:  No changes  If you need a refill on your cardiac medications before your next appointment, please call your pharmacy.    Lab work: We will give you a lab slip for lipid panel, fasting- we can refill your cholesterol medication after this is done.  Please notify the office once you have this checked so we can follow up on it.   If you have labs (blood work) drawn today and your tests are completely normal, you will receive your results only by: Marland Kitchen MyChart Message (if you have MyChart) OR . A paper copy in the mail If you have any lab test that is abnormal or we need to change your treatment, we will call you to review the results.   Testing/Procedures: No new testing needed   Follow-Up: At [facility], you and your health needs are our priority.  As part of our continuing mission to provide you with exceptional heart care, we have created designated Provider Care Teams.  These Care Teams include your primary Cardiologist (physician) and Advanced Practice Providers (APPs -  Physician Assistants and Nurse Practitioners) who all work together to provide you with the care you need, when you need it.  . You will need a follow up appointment in 12 months .   Please call our office 2 months in advance to schedule this appointment.    . Providers on your designated Care Team:   . Nicolasa Ducking, NP . Eula Listen, PA-C . Marisue Ivan, PA-C  Any Other Special Instructions Will Be Listed Below (If Applicable).  For educational health videos Log in to : [email or web address] Or : [email or web address], password : [email or web address]

## 2018-11-04 NOTE — Progress Notes (Signed)
Cardiology Office Note  Date:  11/04/2018   ID:  Jordan Wong, DOB Oct 05, 1950, MRN [id number]  PCP:  Jerl Mina, MD   Chief Complaint  Patient presents with  . other    12 month f/u c/o leg pain/gout but under care/therapy. Meds reviewed verbally with pt.    HPI:  68 year old male with a history of  hepatitis C,  gout,  hyperlipidemia, CAD,  status post CABG. June 2018 EF 55% Who presents for follow up of his CAD, CABG  INTERVAL HISTORY:  The patient reports today for follow up. He is doing well overall, however, he reports a gout flare up in his left knee, which also showed a staph infection.  Treated with cortisone shot and antibiotics   He stays active by attending spin classes and other activities at the gym. He does not endorse chest pain when at the gym. He has been eating healthy and trying to avoid desserts.   Today's Blood pressure 110/80  EKG personally reviewed by myself on todays visit Shows normal sinus rhythm. 76 bpm. LAD  OTHER PAST MEDICAL HISTORY REVIEWED BY ME FOR TODAY'S VISIT:  02/2017 MV:  septal, apical, dist antsept ischemia, EF 50%, 67mm ST dep w/ exercise;   02/2017 Cath:  LM nl, LAD 99p, 50d, D1 90ost, RI 95ost, LCX 45m/d, RCA 95p, EF 55-65%;   CABG x 5: 02/2017  LIMA->LAD, free RIMA->RCA, VG->Diag, VG->RI, VG->OM.  Previous hospitalization reviewed with him,  Unstable angina leading to positive stress test, admission to the hospital  NSTEMI  catheterization Transferred to the hospital for bypass surgery   PMH:   has a past medical history of CAD (coronary artery disease), Hepatitis C (2016), History of blood transfusion (1987), History of echocardiogram, History of gout, HLD (hyperlipidemia), Hyperglycemia, and Pneumonia (2000s X 1).  PSH:    Past Surgical History:  Procedure Laterality Date  . CARDIAC CATHETERIZATION  02/22/2017  . CORONARY ARTERY BYPASS GRAFT N/A 02/23/2017   Procedure: CORONARY ARTERY BYPASS GRAFT times five using  Bilateral Mammary arteries and Right leg saphenous vein;  Surgeon: Alleen Borne, MD;  Location: [facility] OR;  Service: Open Heart Surgery;  Laterality: N/A;  . EYE SURGERY    . FEMUR FRACTURE SURGERY Left 1987; 1988   "1st one didn't heal right; had to go in & put more bone i it"  . INGUINAL HERNIA REPAIR Bilateral 1970s-1990s  . LEFT HEART CATH AND CORONARY ANGIOGRAPHY N/A 02/22/2017   Procedure: Left Heart Cath and Coronary Angiography;  Surgeon: Iran Ouch, MD;  Location: [facility] INVASIVE CV LAB;  Service: Cardiovascular;  Laterality: N/A;  . RETINAL DETACHMENT SURGERY Right   . TEE WITHOUT CARDIOVERSION N/A 02/23/2017   Procedure: TRANSESOPHAGEAL ECHOCARDIOGRAM (TEE);  Surgeon: Alleen Borne, MD;  Location: [facility] OR;  Service: Open Heart Surgery;  Laterality: N/A;    Current Outpatient Medications  Medication Sig Dispense Refill  . acetaminophen (TYLENOL) 325 MG tablet Take 2 tablets (650 mg total) by mouth every 6 (six) hours as needed for mild pain (or Fever >/= 101).    Marland Kitchen aspirin EC 81 MG tablet Take 1 tablet (81 mg total) by mouth daily. 90 tablet 3  . atorvastatin (LIPITOR) 40 MG tablet TAKE 1 TABLET BY MOUTH EVERY DAY FOR 6PM 90 tablet 0  . clopidogrel (PLAVIX) 75 MG tablet Take 1 tablet (75 mg total) by mouth daily. 90 tablet 3  . Melatonin 3 MG TABS Take by mouth.    . metoprolol tartrate (LOPRESSOR)  25 MG tablet Take 1/4 tablet (6.25 mg) by mouth twice daily 45 tablet 3   No current facility-administered medications for this visit.      Allergies:   Patient has no known allergies.   Social History:  The patient  reports that he has never smoked. He has never used smokeless tobacco. He reports that he does not drink alcohol or use drugs.   Family History:   family history includes Alcohol abuse in his mother; Cirrhosis in his mother; Psychiatric Illness in his father.    Review of Systems: Review of Systems  Constitutional: Negative.   Respiratory: Negative.  Negative for  shortness of breath.   Cardiovascular: Negative.  Negative for chest pain.  Gastrointestinal: Negative.   Musculoskeletal: Negative.   Neurological: Negative.   Psychiatric/Behavioral: Negative.   All other systems reviewed and are negative.    PHYSICAL EXAM: VS:  BP 110/80 (BP Location: Left Arm, Patient Position: Sitting, Cuff Size: Normal)   Pulse 76   Ht 5\' 8"  (1.727 m)   Wt 175 lb 8 oz (79.6 kg)   BMI 26.68 kg/m  , BMI Body mass index is 26.68 kg/m.  Constitutional:  oriented to person, place, and time. No distress.  HENT:  Head: Grossly normal Eyes:  no discharge. No scleral icterus.  Neck: No JVD, no carotid bruits  Cardiovascular: Regular rate and rhythm, no murmurs appreciated Pulmonary/Chest: Clear to auscultation bilaterally, no wheezes or rails Abdominal: Soft.  no distension.  no tenderness.  Musculoskeletal: Normal range of motion Neurological:  normal muscle tone. Coordination normal. No atrophy Skin: Skin warm and dry Psychiatric: normal affect, pleasant  Recent Labs: No results found for requested labs within last 8760 hours.    Lipid Panel Lab Results  Component Value Date   CHOL 175 02/21/2017   HDL 38 (L) 02/21/2017   LDLCALC 116 (H) 02/21/2017   TRIG 103 02/21/2017      Wt Readings from Last 3 Encounters:  11/04/18 175 lb 8 oz (79.6 kg)  06/11/17 171 lb 12 oz (77.9 kg)  05/24/17 172 lb (78 kg)     ASSESSMENT AND PLAN:  Coronary artery disease of native artery of native heart with stable angina pectoris (HCC) Plan: EKG 12-Lead  Severe three-vessel disease, s/p CABG Stable angina symptoms Recommended he continue his exercise, he has completed cardiac rehabilitation Continue taking metoprolol tartrate 6.25 mg once a day Does not want to take more metoprolol and does not want a once a day medication  Mixed hyperlipidemia Plan: Overdue for lipid and and liver profile Recommend liver and lipid panel with fasting prior.  Order has been  placed Goal LDL less than 70, preferably 50  S/P CABG x 5 Plan: EKG 12- Lead Medication changes as above  Elevated PSA PSA: 7.46 09/24/2017  Suggested he talk with primary care.  Patient was unaware of implications  Hepatitis C Followed at [facility] We will see if they can draw a lipid panel if not we can do it through our office  Disposition:   F/U  12 months  Total encounter time more than 25 minutes Greater than 50% was spent in counseling and coordination of care with the patient    Orders Placed This Encounter  Procedures  . Lipid Profile  . EKG 12-Lead    I, Diona Browner am acting as a scribe for Julien Nordmann, M.D., Ph.D.  I have reviewed the above documentation for accuracy and completeness, and I agree with the above.  Signed, Dossie Arbourim Dorthy Magnussen, M.D., Ph.D. 11/04/2018  [facility] [place] [phone]

## 2018-12-18 ENCOUNTER — Other Ambulatory Visit: Payer: Self-pay | Admitting: Cardiovascular Disease

## 2018-12-18 DIAGNOSIS — E782 Mixed hyperlipidemia: Secondary | ICD-10-CM

## 2018-12-18 DIAGNOSIS — I2510 Atherosclerotic heart disease of native coronary artery without angina pectoris: Secondary | ICD-10-CM

## 2019-05-05 ENCOUNTER — Other Ambulatory Visit
Admission: RE | Admit: 2019-05-05 | Discharge: 2019-05-05 | Disposition: A | Discharge status: Home / Self Care | Payer: Medicare HMO | Source: Ambulatory Visit | Attending: Gastroenterology | Admitting: Gastroenterology

## 2019-05-05 ENCOUNTER — Other Ambulatory Visit: Payer: Self-pay

## 2019-05-05 DIAGNOSIS — Z01812 Encounter for preprocedural laboratory examination: Secondary | ICD-10-CM | POA: Diagnosis present

## 2019-05-05 DIAGNOSIS — Z20828 Contact with and (suspected) exposure to other viral communicable diseases: Secondary | ICD-10-CM | POA: Insufficient documentation

## 2019-05-06 LAB — SARS CORONAVIRUS 2 (TAT 6-24 HRS): SARS Coronavirus 2: NEGATIVE

## 2019-10-23 ENCOUNTER — Other Ambulatory Visit: Payer: Self-pay | Admitting: Cardiovascular Disease

## 2019-11-11 NOTE — Progress Notes (Signed)
Cardiology Office Note  Date:  11/12/2019   ID:  Jordan Wong, DOB 1951/03/10, MRN [id number]  PCP:  Jerl Mina, MD   Chief Complaint  Patient presents with  . office visit    12 mo F/U; Meds verbally reviewed with patient.    HPI:  69 year old male with a history of  hepatitis C,  gout,  hyperlipidemia, CAD,  status post CABG. June 2018 EF 55% Who presents for follow up of his CAD, CABG  Right leg neurologic issue, numbness into the foot From low back disease Joint issue: shoulder, knee, hand  Has not been going to gym  Reports that some of his medications were held when he had recent carpal tunnel surgery Thinks the atorvastatin was held, not sure he was supposed to restart it so he restarted half a pill instead of whole pill  No recent lipid panel since 2018 Lab work from primary care reviewed  EKG personally reviewed by myself on todays visit Shows normal sinus rhythm rate 73 bpm left axis deviation  Past medical history reviewed  02/2017 MV:  septal, apical, dist antsept ischemia, EF 50%, 55mm ST dep w/ exercise;   02/2017 Cath:  LM nl, LAD 99p, 50d, D1 90ost, RI 95ost, LCX 97m/d, RCA 95p, EF 55-65%;   CABG x 5: 02/2017  LIMA->LAD, free RIMA->RCA, VG->Diag, VG->RI, VG->OM.  Previous hospitalization reviewed with him,  Unstable angina leading to positive stress test, admission to the hospital  NSTEMI  catheterization Transferred to the hospital for bypass surgery   PMH:   has a past medical history of CAD (coronary artery disease), Hepatitis C (2016), History of blood transfusion (1987), History of echocardiogram, History of gout, HLD (hyperlipidemia), Hyperglycemia, and Pneumonia (2000s X 1).  PSH:    Past Surgical History:  Procedure Laterality Date  . CARDIAC CATHETERIZATION  02/22/2017  . CORONARY ARTERY BYPASS GRAFT N/A 02/23/2017   Procedure: CORONARY ARTERY BYPASS GRAFT times five using Bilateral Mammary arteries and Right leg saphenous vein;   Surgeon: Alleen Borne, MD;  Location: [facility] OR;  Service: Open Heart Surgery;  Laterality: N/A;  . EYE SURGERY    . FEMUR FRACTURE SURGERY Left 1987; 1988   "1st one didn't heal right; had to go in & put more bone i it"  . HAND SURGERY Right   . INGUINAL HERNIA REPAIR Bilateral 1970s-1990s  . LEFT HEART CATH AND CORONARY ANGIOGRAPHY N/A 02/22/2017   Procedure: Left Heart Cath and Coronary Angiography;  Surgeon: Iran Ouch, MD;  Location: [facility] INVASIVE CV LAB;  Service: Cardiovascular;  Laterality: N/A;  . NOSE SURGERY Left    pre-cancerous cells removed  . RETINAL DETACHMENT SURGERY Right   . TEE WITHOUT CARDIOVERSION N/A 02/23/2017   Procedure: TRANSESOPHAGEAL ECHOCARDIOGRAM (TEE);  Surgeon: Alleen Borne, MD;  Location: [facility] OR;  Service: Open Heart Surgery;  Laterality: N/A;    Current Outpatient Medications  Medication Sig Dispense Refill  . acetaminophen (TYLENOL) 325 MG tablet Take 2 tablets (650 mg total) by mouth every 6 (six) hours as needed for mild pain (or Fever >/= 101).    Marland Kitchen aspirin EC 81 MG tablet Take 1 tablet (81 mg total) by mouth daily. 90 tablet 3  . atorvastatin (LIPITOR) 40 MG tablet TAKE 1 TABLET BY MOUTH EVERY DAY FOR 6PM (Patient taking differently: Take 20 mg by mouth daily at 6 PM. ) 90 tablet 3  . clopidogrel (PLAVIX) 75 MG tablet TAKE 1 TABLET BY MOUTH EVERY DAY 90 tablet 3  .  Ferrous Sulfate (IRON PO) Take by mouth daily.    Marland Kitchen gabapentin (NEURONTIN) 300 MG capsule Take 300 mg by mouth 3 (three) times daily as needed. Taking one capsule at HS    . Melatonin 3 MG TABS Take by mouth.    . metoprolol tartrate (LOPRESSOR) 25 MG tablet Take 1/4 tablet (6.25 mg) by mouth twice daily 45 tablet 3  . Pyridoxine HCl (VITAMIN B6 PO) Take by mouth daily.    . vitamin B-12 (CYANOCOBALAMIN) 500 MCG tablet Take 500 mcg by mouth in the morning and at bedtime.     No current facility-administered medications for this visit.     Allergies:   Patient has no known  allergies.   Social History:  The patient  reports that he has never smoked. He has never used smokeless tobacco. He reports that he does not drink alcohol or use drugs.   Family History:   family history includes Alcohol abuse in his mother; Cirrhosis in his mother; Psychiatric Illness in his father.    Review of Systems: Review of Systems  Constitutional: Negative.   HENT: Negative.   Respiratory: Negative.  Negative for shortness of breath.   Cardiovascular: Negative.  Negative for chest pain.  Gastrointestinal: Negative.   Musculoskeletal: Negative.   Neurological: Negative.   Psychiatric/Behavioral: Negative.   All other systems reviewed and are negative.   PHYSICAL EXAM: VS:  BP 120/80 (BP Location: Left Arm, Patient Position: Sitting, Cuff Size: Normal)   Pulse 73   Ht 5\' 8"  (1.727 m)   Wt 182 lb (82.6 kg)   SpO2 98%   BMI 27.67 kg/m  , BMI Body mass index is 27.67 kg/m.  Constitutional:  oriented to person, place, and time. No distress.  HENT:  Head: Grossly normal Eyes:  no discharge. No scleral icterus.  Neck: No JVD, no carotid bruits  Cardiovascular: Regular rate and rhythm, no murmurs appreciated Pulmonary/Chest: Clear to auscultation bilaterally, no wheezes or rails Abdominal: Soft.  no distension.  no tenderness.  Musculoskeletal: Normal range of motion Neurological:  normal muscle tone. Coordination normal. No atrophy Skin: Skin warm and dry Psychiatric: normal affect, pleasant  Recent Labs: No results found for requested labs within last 8760 hours.    Lipid Panel Lab Results  Component Value Date   CHOL 175 02/21/2017   HDL 38 (L) 02/21/2017   LDLCALC 116 (H) 02/21/2017   TRIG 103 02/21/2017      Wt Readings from Last 3 Encounters:  11/12/19 182 lb (82.6 kg)  11/04/18 175 lb 8 oz (79.6 kg)  06/11/17 171 lb 12 oz (77.9 kg)    ASSESSMENT AND PLAN:  Coronary artery disease of native artery of native heart with stable angina pectoris  (HCC) Severe three-vessel disease, s/p CABG Stable anginal symptoms No further ischemic work-up at this time Recommend he restart gym workout Lipid panel ordered today, goal LDL less than 70 Currently on Lipitor 20, down from 40, cutting the pill in half  Mixed hyperlipidemia Lab work ordered today  S/P CABG x 5 No medication changes at this time, recommended he restart workout, lab work as above  Elevated PSA Followed by primary care  Hepatitis C Followed at 06/13/17 Lab work through primary care  Disposition:   F/U  12 months  Total encounter time more than 25 minutes Greater than 50% was spent in counseling and coordination of care with the patient    No orders of the defined types were placed in  this encounter.   I, Margit Banda am acting as a scribe for Ida Rogue, M.D., Ph.D.  I have reviewed the above documentation for accuracy and completeness, and I agree with the above.   Signed, Esmond Plants, M.D., Ph.D. 11/12/2019  [address], [place]

## 2019-11-12 ENCOUNTER — Other Ambulatory Visit: Payer: Self-pay

## 2019-11-12 ENCOUNTER — Ambulatory Visit (INDEPENDENT_AMBULATORY_CARE_PROVIDER_SITE_OTHER): Payer: Medicare HMO | Admitting: Cardiovascular Disease

## 2019-11-12 ENCOUNTER — Encounter: Payer: Self-pay | Admitting: Cardiovascular Disease

## 2019-11-12 VITALS — BP 120/80 | HR 73 | Ht 68.0 in | Wt 182.0 lb

## 2019-11-12 DIAGNOSIS — E782 Mixed hyperlipidemia: Secondary | ICD-10-CM

## 2019-11-12 DIAGNOSIS — E785 Hyperlipidemia, unspecified: Secondary | ICD-10-CM

## 2019-11-12 DIAGNOSIS — I25118 Atherosclerotic heart disease of native coronary artery with other forms of angina pectoris: Secondary | ICD-10-CM | POA: Diagnosis not present

## 2019-11-12 DIAGNOSIS — Z951 Presence of aortocoronary bypass graft: Secondary | ICD-10-CM

## 2019-11-12 NOTE — Patient Instructions (Addendum)
CMP and lipids today  Medication Instructions:  No changes  If you need a refill on your cardiac medications before your next appointment, please call your pharmacy.    Lab work: No new labs needed   If you have labs (blood work) drawn today and your tests are completely normal, you will receive your results only by: Marland Kitchen MyChart Message (if you have MyChart) OR . A paper copy in the mail If you have any lab test that is abnormal or we need to change your treatment, we will call you to review the results.   Testing/Procedures: No new testing needed   Follow-Up: At [facility], you and your health needs are our priority.  As part of our continuing mission to provide you with exceptional heart care, we have created designated Provider Care Teams.  These Care Teams include your primary Cardiologist (physician) and Advanced Practice Providers (APPs -  Physician Assistants and Nurse Practitioners) who all work together to provide you with the care you need, when you need it.  . You will need a follow up appointment in 12 months   . Providers on your designated Care Team:   . Nicolasa Ducking, NP . Eula Listen, PA-C . Marisue Ivan, PA-C  Any Other Special Instructions Will Be Listed Below (If Applicable).  For educational health videos Log in to : [email or web address] Or : [email or web address], password : [email or web address]

## 2019-11-13 LAB — COMPREHENSIVE METABOLIC PANEL
ALT: 21 IU/L (ref 0–44)
AST: 21 IU/L (ref 0–40)
Albumin/Globulin Ratio: 2.4 — ABNORMAL HIGH (ref 1.2–2.2)
Albumin: 4.8 g/dL (ref 3.8–4.8)
Alkaline Phosphatase: 60 IU/L (ref 39–117)
BUN/Creatinine Ratio: 14 (ref 10–24)
BUN: 13 mg/dL (ref 8–27)
Bilirubin Total: 0.6 mg/dL (ref 0.0–1.2)
CO2: 23 mmol/L (ref 20–29)
Calcium: 9.5 mg/dL (ref 8.6–10.2)
Chloride: 103 mmol/L (ref 96–106)
Creatinine, Ser: 0.94 mg/dL (ref 0.76–1.27)
GFR calc Af Amer: 96 mL/min/{1.73_m2} (ref >59)
GFR calc non Af Amer: 83 mL/min/{1.73_m2} (ref >59)
Globulin, Total: 2 g/dL (ref 1.5–4.5)
Glucose: 100 mg/dL — ABNORMAL HIGH (ref 65–99)
Potassium: 4.7 mmol/L (ref 3.5–5.2)
Sodium: 142 mmol/L (ref 134–144)
Total Protein: 6.8 g/dL (ref 6.0–8.5)

## 2019-11-13 LAB — LIPID PANEL
Chol/HDL Ratio: 2.8 ratio (ref 0.0–5.0)
Cholesterol, Total: 116 mg/dL (ref 100–199)
HDL: 42 mg/dL (ref >39)
LDL Chol Calc (NIH): 56 mg/dL (ref 0–99)
Triglycerides: 92 mg/dL (ref 0–149)
VLDL Cholesterol Cal: 18 mg/dL (ref 5–40)

## 2019-12-19 ENCOUNTER — Other Ambulatory Visit: Payer: Self-pay | Admitting: Cardiovascular Disease

## 2019-12-19 DIAGNOSIS — E782 Mixed hyperlipidemia: Secondary | ICD-10-CM

## 2020-01-03 IMAGING — MR MR LUMBAR SPINE W/O CM
5 series · 48 of 48 positions shown · non-contrast
Comparison: None.

CLINICAL DATA: Right leg pain, weakness, and numbness.
Spondylolisthesis.

EXAM:
MRI LUMBAR SPINE WITHOUT CONTRAST
TECHNIQUE: Multiplanar, multisequence MR imaging of the lumbar spine was
performed. No intravenous contrast was administered.

[Series 3: T2 · sagittal · 4.0mm · 0.78mm/px · 6 of 12 slices shown (1 of 2)]
[im 1/12]
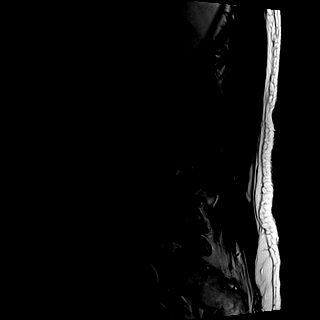
[im 3/12]
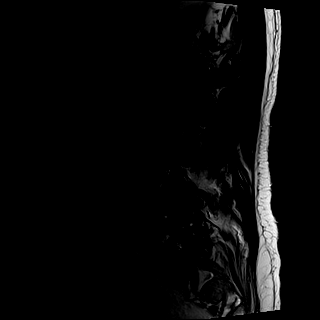
[im 5/12]
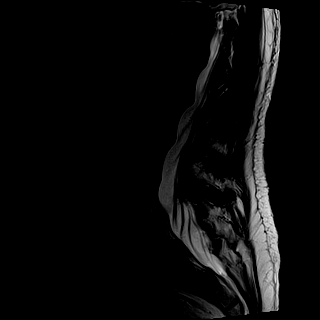
[im 7/12]
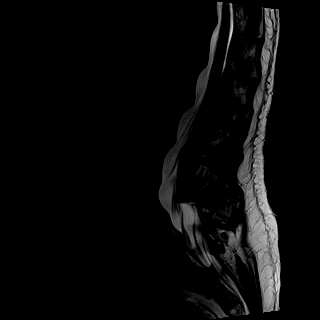
[im 9/12]
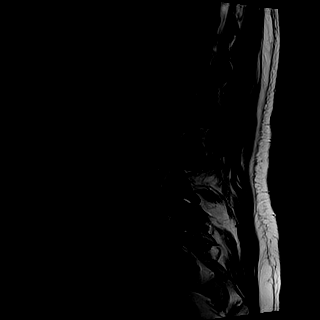
[im 12/12]
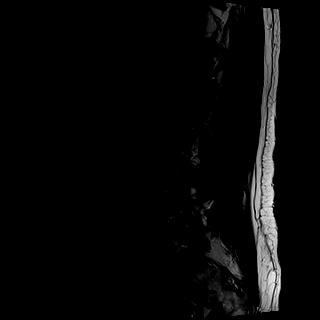

[Series 4: T1 · sagittal · 4.0mm · 0.98mm/px · 5 of 12 slices shown (1 of 2)]
[im 1/12]
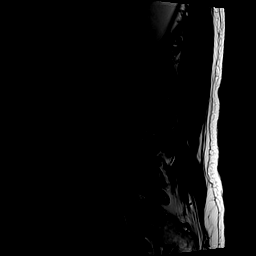
[im 3/12]
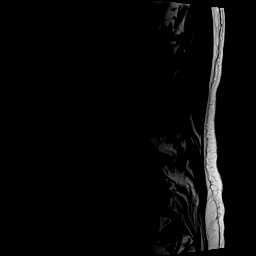
[im 6/12]
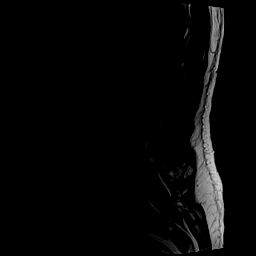
[im 9/12]
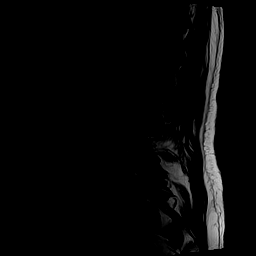
[im 12/12]
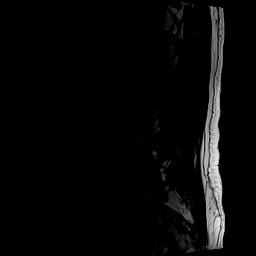

[Series 5: STIR · sagittal · 4.0mm · 0.49mm/px · 5 of 12 slices shown]
[im 1/12]
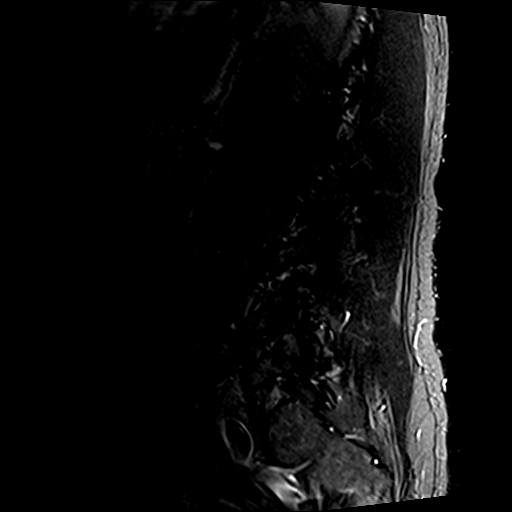
[im 3/12]
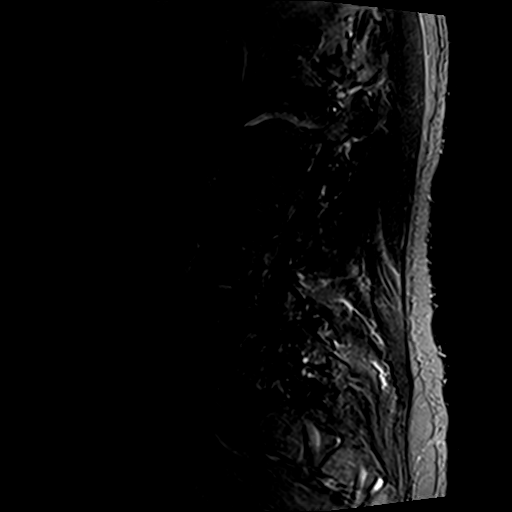
[im 6/12]
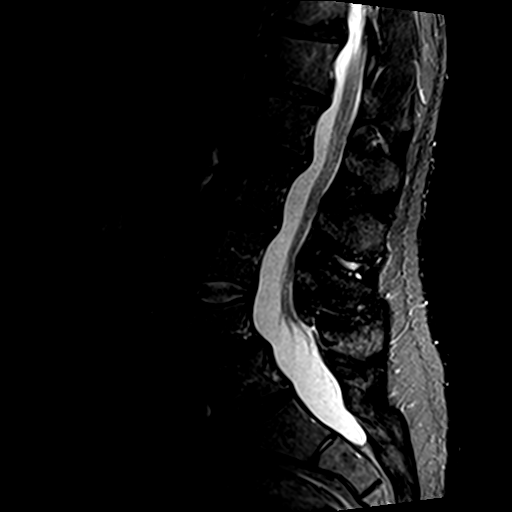
[im 9/12]
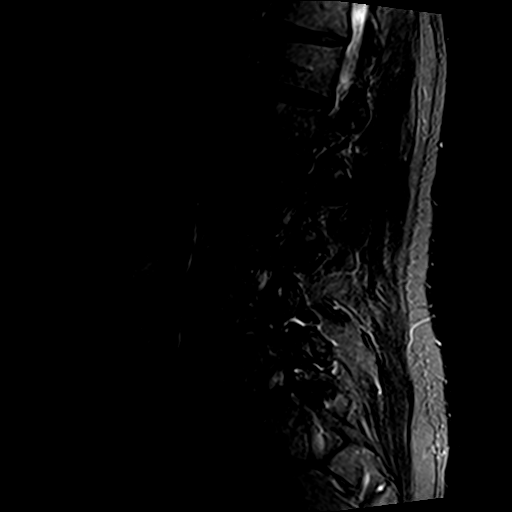
[im 12/12]
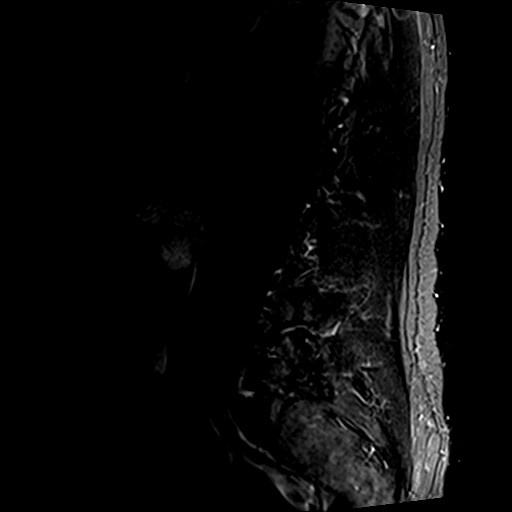

[Series 6: T2 · axial · 4.0mm · 0.70mm/px · z∈[-61,+149]mm · 16 of 36 slices shown (2 of 2)]
[im 1/36]
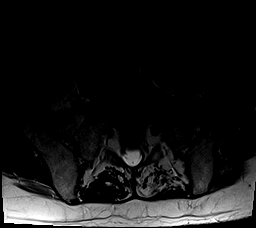
[im 3/36]
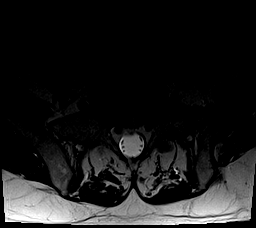
[im 5/36]
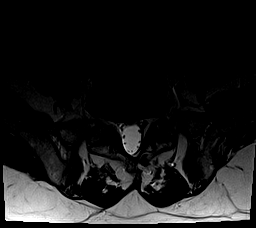
[im 8/36]
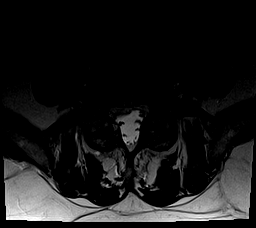
[im 10/36]
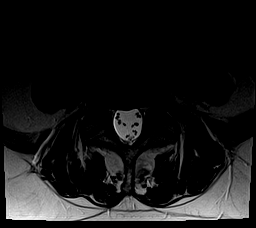
[im 12/36]
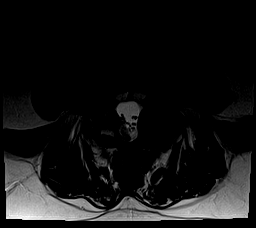
[im 15/36]
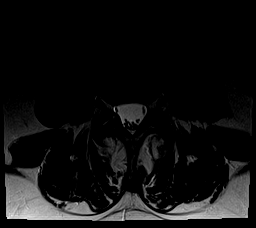
[im 17/36]
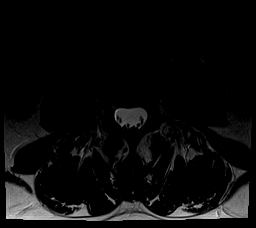
[im 19/36]
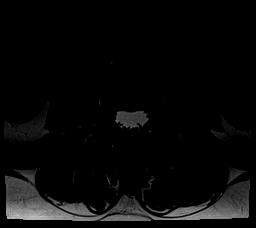
[im 22/36]
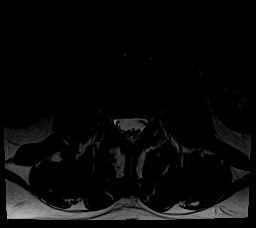
[im 24/36]
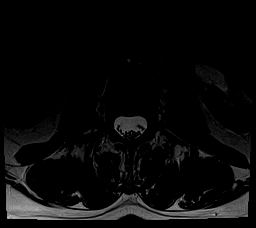
[im 26/36]
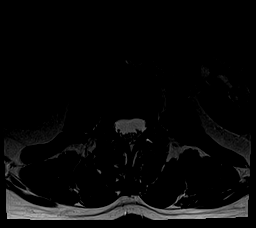
[im 29/36]
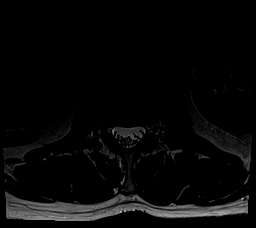
[im 31/36]
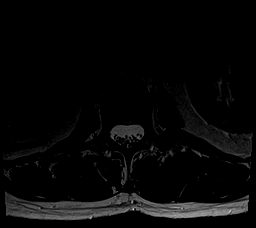
[im 33/36]
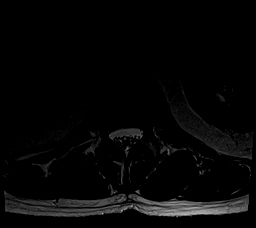
[im 36/36]
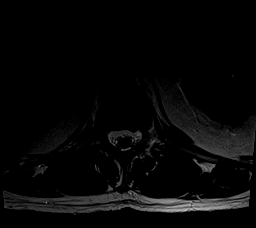

[Series 7: T1 · axial · 4.0mm · 0.74mm/px · z∈[-63,+153]mm · 16 of 36 slices shown (2 of 2)]
[im 1/36]
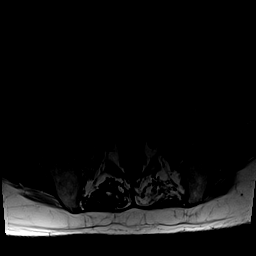
[im 3/36]
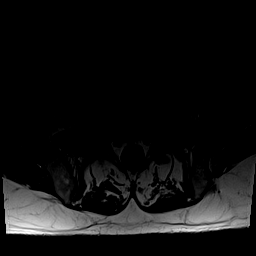
[im 5/36]
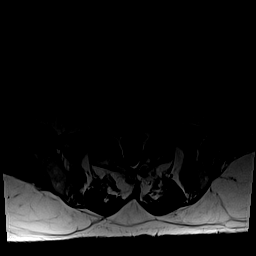
[im 8/36]
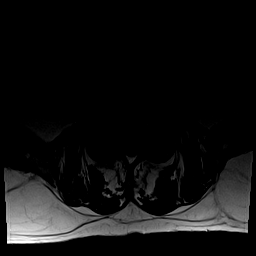
[im 10/36]
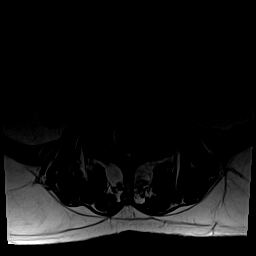
[im 12/36]
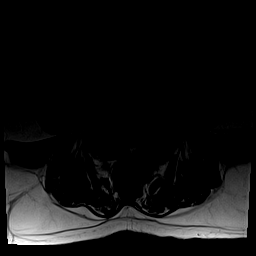
[im 15/36]
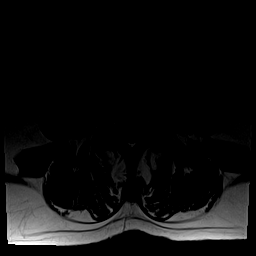
[im 17/36]
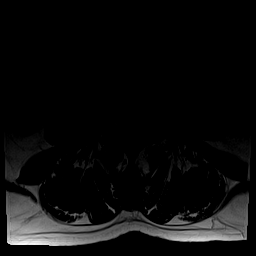
[im 19/36]
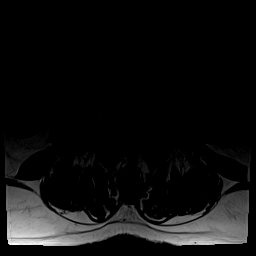
[im 22/36]
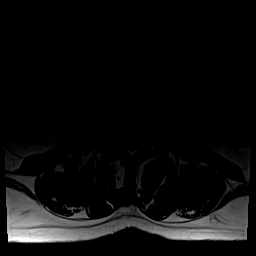
[im 24/36]
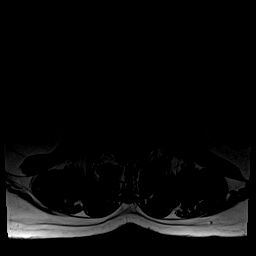
[im 26/36]
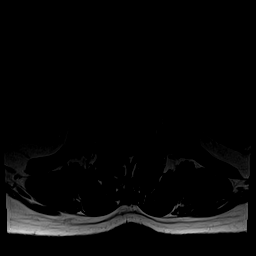
[im 29/36]
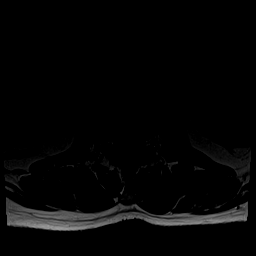
[im 31/36]
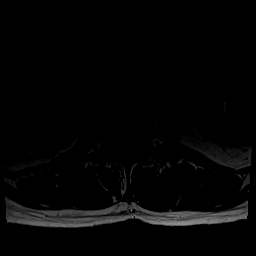
[im 33/36]
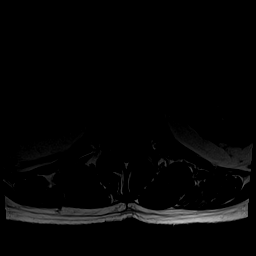
[im 36/36]
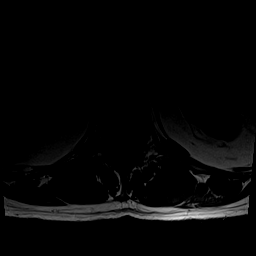

[48 of 48 positions shown; findings below may reference images not displayed]

FINDINGS: Segmentation:  Standard.

Alignment: Mildly exaggerated lumbar lordosis. Bilateral L4 pars
defects with 12 mm anterolisthesis of L4 on L5. Bilateral L5 pars
defects with trace anterolisthesis of L5 on S1. Trace retrolisthesis
of L1 on L2 and L2 on L3.

Vertebrae: No acute fracture or suspicious osseous lesion. Small T11
inferior endplate Schmorl's node. Type 2 Modic endplate changes at
L4-5 asymmetric to the left.

Conus medullaris and cauda equina: Conus extends to the L1 level.
Conus and cauda equina appear normal.

Paraspinal and other soft tissues: Unremarkable.

Disc levels:

Disc desiccation throughout the lumbar spine with exception of L3-4.
Severe disc space narrowing at L4-5 with mild narrowing at L1-2,
L2-3, and L5-S1.

T12-L1: Mild disc bulging and small left paracentral disc extrusion
with superior migration without stenosis.

L1-2: Mild circumferential disc bulging and mild facet hypertrophy
without stenosis.

L2-3: Minimal disc bulging and mild facet hypertrophy without
stenosis.

L3-4: Minimal endplate spurring and mild facet hypertrophy without
stenosis.

L4-5: Anterolisthesis with bulging uncovered disc and spurring about
the pars defects result in severe bilateral neural foraminal
stenosis with potential bilateral L4 nerve root impingement. No
spinal stenosis.

L5-S1: Disc bulging, a small right subarticular disc extrusion with
superior migration to the mid L5 vertebral body level, disc bulging,
and spurring about the pars defects result in mild-to-moderate right
lateral recess stenosis and mild bilateral neural foraminal stenosis
without spinal stenosis. The disc extrusion may contact the right L5
nerve root without frank neural compression identified.
IMPRESSION: 1. L4 pars defects with grade 2 anterolisthesis and advanced L4-5
disc degeneration. Severe bilateral neural foraminal stenosis with
L4 nerve root impingement.
2. L5 pars defects with trace anterolisthesis. Right subarticular
disc extrusion at L5-S1 close to but not compressing the right L5
nerve root.

## 2020-11-01 ENCOUNTER — Other Ambulatory Visit: Payer: Self-pay | Admitting: Cardiovascular Disease

## 2020-11-01 NOTE — Telephone Encounter (Signed)
Please schedule 12 month F/U appointment. Thank you! 

## 2020-11-01 NOTE — Telephone Encounter (Signed)
Scheduled for 2-16

## 2020-11-03 ENCOUNTER — Ambulatory Visit: Payer: Medicare HMO | Admitting: Cardiovascular Disease

## 2020-11-03 ENCOUNTER — Encounter: Payer: Self-pay | Admitting: Cardiovascular Disease

## 2020-11-03 ENCOUNTER — Other Ambulatory Visit: Payer: Self-pay

## 2020-11-03 VITALS — BP 126/74 | HR 73 | Ht 69.0 in | Wt 181.0 lb

## 2020-11-03 DIAGNOSIS — I25118 Atherosclerotic heart disease of native coronary artery with other forms of angina pectoris: Secondary | ICD-10-CM

## 2020-11-03 DIAGNOSIS — Z951 Presence of aortocoronary bypass graft: Secondary | ICD-10-CM | POA: Diagnosis not present

## 2020-11-03 DIAGNOSIS — E782 Mixed hyperlipidemia: Secondary | ICD-10-CM | POA: Diagnosis not present

## 2020-11-03 DIAGNOSIS — D696 Thrombocytopenia, unspecified: Secondary | ICD-10-CM | POA: Diagnosis not present

## 2020-11-03 MED ORDER — ATORVASTATIN CALCIUM 40 MG PO TABS: 20.0000 mg | ORAL_TABLET | Freq: Every day | ORAL | 3 refills | Status: DC

## 2020-11-03 MED ORDER — METOPROLOL TARTRATE 25 MG PO TABS: ORAL_TABLET | ORAL | 3 refills | Status: DC

## 2020-11-03 MED ORDER — CLOPIDOGREL BISULFATE 75 MG PO TABS: 75.0000 mg | ORAL_TABLET | Freq: Every day | ORAL | 3 refills | Status: DC

## 2020-11-03 NOTE — Progress Notes (Signed)
Cardiology Office Note  Date:  11/03/2020   ID:  Jordan Wong, DOB [birth year], MRN [id number]  PCP:  Jerl Mina, MD   Chief Complaint  Patient presents with  . Other    12 month follow up - Meds reviewed verbally with patient.     HPI:  70 year old male with a history of  hepatitis C,  gout,  hyperlipidemia, CAD,  status post CABG. June 2018 EF 55% Who presents for follow up of his CAD, CABG  No chest pain, no SOB [facility] the [facility]-2x a week, sometimes tired, Has a bike at home,  Has a motorcycle Runs a motorcycle shop  Prior Right leg neurologic issue, numbness into the foot From low back disease, comes and goes Joint issue: shoulder, knee, hand  On higher dose metoprolol had bradycardia Cut the dose back  EKG personally reviewed by myself on todays visit Shows normal sinus rhythm rate 73 bpm left axis deviation  Past medical history reviewed  02/2017 MV:  septal, apical, dist antsept ischemia, EF 50%, 28mm ST dep w/ exercise;   02/2017 Cath:  LM nl, LAD 99p, 50d, D1 90ost, RI 95ost, LCX 15m/d, RCA 95p, EF 55-65%;   CABG x 5: 02/2017  LIMA->LAD, free RIMA->RCA, VG->Diag, VG->RI, VG->OM.  Previous hospitalization reviewed with him,  Unstable angina leading to positive stress test, admission to the hospital  NSTEMI  catheterization Transferred to the hospital for bypass surgery   PMH:   has a past medical history of CAD (coronary artery disease), Hepatitis C (2016), History of blood transfusion (1987), History of echocardiogram, History of gout, HLD (hyperlipidemia), Hyperglycemia, and Pneumonia (2000s X 1).  PSH:    Past Surgical History:  Procedure Laterality Date  . CARDIAC CATHETERIZATION  02/22/2017  . CORONARY ARTERY BYPASS GRAFT N/A 02/23/2017   Procedure: CORONARY ARTERY BYPASS GRAFT times five using Bilateral Mammary arteries and Right leg saphenous vein;  Surgeon: Alleen Borne, MD;  Location: [facility] OR;  Service: Open Heart Surgery;  Laterality: N/A;   . EYE SURGERY    . FEMUR FRACTURE SURGERY Left 1987; 1988   "1st one didn't heal right; had to go in & put more bone i it"  . HAND SURGERY Right   . INGUINAL HERNIA REPAIR Bilateral 1970s-1990s  . LEFT HEART CATH AND CORONARY ANGIOGRAPHY N/A 02/22/2017   Procedure: Left Heart Cath and Coronary Angiography;  Surgeon: Iran Ouch, MD;  Location: [facility] INVASIVE CV LAB;  Service: Cardiovascular;  Laterality: N/A;  . NOSE SURGERY Left    pre-cancerous cells removed  . RETINAL DETACHMENT SURGERY Right   . TEE WITHOUT CARDIOVERSION N/A 02/23/2017   Procedure: TRANSESOPHAGEAL ECHOCARDIOGRAM (TEE);  Surgeon: Alleen Borne, MD;  Location[facility];  Service: Open Heart Surgery;  Laterality: N/A;    Current Outpatient Medications  Medication Sig Dispense Refill  . acetaminophen (TYLENOL) 325 MG tablet Take 2 tablets (650 mg total) by mouth every 6 (six) hours as needed for mild pain (or Fever >/= 101).    Marland Kitchen aspirin EC 81 MG tablet Take 1 tablet (81 mg total) by mouth daily. 90 tablet 3  . Ferrous Sulfate (IRON PO) Take by mouth daily.    Marland Kitchen gabapentin (NEURONTIN) 300 MG capsule Take 300 mg by mouth 3 (three) times daily as needed. Taking one capsule at HS    . Melatonin 3 MG TABS Take by mouth.    . Pyridoxine HCl (VITAMIN B6 PO) Take by mouth daily.    Marland Kitchen  vitamin B-12 (CYANOCOBALAMIN) 500 MCG tablet Take 500 mcg by mouth in the morning and at bedtime.    Marland Kitchen atorvastatin (LIPITOR) 40 MG tablet Take 0.5 tablets (20 mg total) by mouth daily at 6 PM. 45 tablet 3  . clopidogrel (PLAVIX) 75 MG tablet Take 1 tablet (75 mg total) by mouth daily. 90 tablet 3  . metoprolol tartrate (LOPRESSOR) 25 MG tablet TAKE 1/4 TABLET (6.25 MG) BY MOUTH TWICE DAILY 45 tablet 3   No current facility-administered medications for this visit.     Allergies:   Patient has no known allergies.   Social History:  The patient  reports that he has never smoked. He has never used smokeless tobacco. He reports that he does not  drink alcohol and does not use drugs.   Family History:   family history includes Alcohol abuse in his mother; Cirrhosis in his mother; Psychiatric Illness in his father.    Review of Systems: Review of Systems  Constitutional: Negative.   HENT: Negative.   Respiratory: Negative.  Negative for shortness of breath.   Cardiovascular: Negative.  Negative for chest pain.  Gastrointestinal: Negative.   Musculoskeletal: Negative.   Neurological: Negative.   Psychiatric/Behavioral: Negative.   All other systems reviewed and are negative.   PHYSICAL EXAM: VS:  BP 126/74 (BP Location: Left Arm, Patient Position: Sitting, Cuff Size: Normal)   Pulse 73   Ht 5\' 9"  (1.753 m)   Wt 181 lb (82.1 kg)   BMI 26.73 kg/m  , BMI Body mass index is 26.73 kg/m.  Constitutional:  oriented to person, place, and time. No distress.  HENT:  Head: Grossly normal Eyes:  no discharge. No scleral icterus.  Neck: No JVD, no carotid bruits  Cardiovascular: Regular rate and rhythm, no murmurs appreciated Pulmonary/Chest: Clear to auscultation bilaterally, no wheezes or rails Abdominal: Soft.  no distension.  no tenderness.  Musculoskeletal: Normal range of motion Neurological:  normal muscle tone. Coordination normal. No atrophy Skin: Skin warm and dry Psychiatric: normal affect, pleasant  Recent Labs: 11/12/2019: ALT 21; BUN 13; Creatinine, Ser 0.94; Potassium 4.7; Sodium 142    Lipid Panel Lab Results  Component Value Date   CHOL 116 11/12/2019   HDL 42 11/12/2019   LDLCALC 56 11/12/2019   TRIG 92 11/12/2019      Wt Readings from Last 3 Encounters:  11/03/20 181 lb (82.1 kg)  11/12/19 182 lb (82.6 kg)  11/04/18 175 lb 8 oz (79.6 kg)    ASSESSMENT AND PLAN:  Coronary artery disease of native artery of native heart with stable angina pectoris (HCC) Severe three-vessel disease, s/p CABG Stable anginal symptoms, no change in his symptoms Currently on Lipitor 20, was previously on  40 Prefers to have PMD check lipids  Mixed hyperlipidemia As above Reported last years numbers that were well controlled with Lipitor 20 not 40  S/P CABG x 5 Currently with no symptoms of angina. No further workup at this time. Continue current medication regimen.  Elevated PSA Followed by primary care  Hepatitis C Followed at [facility] stable  Total encounter time more than 25 minutes Greater than 50% was spent in counseling and coordination of care with the patient    Orders Placed This Encounter  Procedures  . EKG 12-Lead    Signed, [facility], M.D., Ph.D. 11/03/2020  [facility] [place], [place]

## 2020-11-03 NOTE — Patient Instructions (Signed)
Medication Instructions:  No changes  If you need a refill on your cardiac medications before your next appointment, please call your pharmacy.    Lab work: No new labs needed   If you have labs (blood work) drawn today and your tests are completely normal, you will receive your results only by: . MyChart Message (if you have MyChart) OR . A paper copy in the mail If you have any lab test that is abnormal or we need to change your treatment, we will call you to review the results.   Testing/Procedures: No new testing needed   Follow-Up: At [facility], you and your health needs are our priority.  As part of our continuing mission to provide you with exceptional heart care, we have created designated Provider Care Teams.  These Care Teams include your primary Cardiologist (physician) and Advanced Practice Providers (APPs -  Physician Assistants and Nurse Practitioners) who all work together to provide you with the care you need, when you need it.  . You will need a follow up appointment in 12 months  . Providers on your designated Care Team:   . Christopher Berge, NP . Ryan Dunn, PA-C . Jacquelyn Visser, PA-C  Any Other Special Instructions Will Be Listed Below (If Applicable).  COVID-19 Vaccine Information can be found at: [email or web address] For questions related to vaccine distribution or appointments, please email [email or web address] or call [phone].     

## 2022-02-02 ENCOUNTER — Other Ambulatory Visit: Payer: Self-pay | Admitting: Cardiovascular Disease

## 2022-02-02 NOTE — Telephone Encounter (Signed)
Please schedule overdue F/U appointment for 90 day refills. Thank you! 

## 2022-02-02 NOTE — Telephone Encounter (Signed)
Scheduled

## 2022-03-03 ENCOUNTER — Other Ambulatory Visit: Payer: Self-pay | Admitting: Cardiovascular Disease

## 2022-04-02 ENCOUNTER — Other Ambulatory Visit: Payer: Self-pay | Admitting: Cardiovascular Disease

## 2022-04-05 ENCOUNTER — Encounter: Payer: Self-pay | Admitting: Cardiovascular Disease

## 2022-04-05 ENCOUNTER — Ambulatory Visit: Payer: Medicare HMO | Admitting: Cardiovascular Disease

## 2022-04-05 VITALS — BP 100/70 | HR 69 | Ht 68.0 in | Wt 183.5 lb

## 2022-04-05 DIAGNOSIS — D696 Thrombocytopenia, unspecified: Secondary | ICD-10-CM

## 2022-04-05 DIAGNOSIS — Z951 Presence of aortocoronary bypass graft: Secondary | ICD-10-CM | POA: Diagnosis not present

## 2022-04-05 DIAGNOSIS — E782 Mixed hyperlipidemia: Secondary | ICD-10-CM | POA: Diagnosis not present

## 2022-04-05 DIAGNOSIS — I25118 Atherosclerotic heart disease of native coronary artery with other forms of angina pectoris: Secondary | ICD-10-CM

## 2022-04-05 MED ORDER — SILDENAFIL CITRATE 20 MG PO TABS: 20.0000 mg | ORAL_TABLET | Freq: Three times a day (TID) | ORAL | 1 refills | Status: DC | PRN

## 2022-04-05 NOTE — Progress Notes (Signed)
Cardiology Office Note  Date:  04/05/2022   ID:  Jordan Wong, DOB Mar 02, 1951, MRN [id number]  PCP:  Jerl Mina, MD   Chief Complaint  Patient presents with   12 month follow up     "Doing well." Medications reviewed by the patient verbally.     HPI:  71 year old male with a history of  hepatitis C (successfully treated), liver cirrhosis, remote alcohol gout,  hyperlipidemia, CAD,  status post CABG. 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  Previous hospitalization reviewed with him,  Unstable angina leading to positive stress test, admission to the hospital  NSTEMI  catheterization Transferred to the hospital for bypass surgery   PMH:   has a past medical history of CAD (coronary artery disease), Hepatitis C (2016), History of blood transfusion (1987), History of echocardiogram, History of gout, HLD (hyperlipidemia), Hyperglycemia, and Pneumonia (2000s X 1).  PSH:    Past Surgical History:  Procedure Laterality Date   CARDIAC CATHETERIZATION  02/22/2017   CORONARY ARTERY BYPASS GRAFT N/A 02/23/2017   Procedure: CORONARY ARTERY BYPASS  GRAFT times five using Bilateral Mammary arteries and Right leg saphenous vein;  Surgeon: Alleen Borne, MD;  Location: [facility] OR;  Service: Open Heart Surgery;  Laterality: N/A;   EYE SURGERY     FEMUR FRACTURE SURGERY Left 1987; 1988   "1st one didn't heal right; had to go in & put more bone i it"   HAND SURGERY Right    INGUINAL HERNIA REPAIR Bilateral [phone]   LEFT HEART CATH AND CORONARY ANGIOGRAPHY N/A 02/22/2017   Procedure: Left Heart Cath and Coronary Angiography;  Surgeon: Iran Ouch, MD;  Location: [facility] INVASIVE CV LAB;  Service: Cardiovascular;  Laterality: N/A;   NOSE SURGERY Left    pre-cancerous cells removed   RETINAL DETACHMENT SURGERY Right    TEE WITHOUT CARDIOVERSION N/A 02/23/2017   Procedure: TRANSESOPHAGEAL ECHOCARDIOGRAM (TEE);  Surgeon: Alleen Borne, MD;  Location: [facility] OR;  Service: Open Heart Surgery;  Laterality: N/A;    Current Outpatient Medications  Medication Sig Dispense Refill   acetaminophen (TYLENOL) 325 MG tablet Take 2 tablets (650 mg total) by mouth every 6 (six) hours as needed for mild pain (or Fever >/= 101).     aspirin EC 81 MG tablet Take 1 tablet (81 mg total) by mouth daily. 90 tablet 3   atorvastatin (LIPITOR) 40 MG tablet Take 0.5 tablets (20 mg total) by mouth daily at 6 PM. 45 tablet 3   clopidogrel (PLAVIX) 75 MG tablet Take 1 tablet (75 mg total) by mouth daily. KEEP FOLLOW UP APPOINTMENT TO  REQUEST MORE REFILLS AT. 15 tablet 0   Ferrous Sulfate (IRON PO) Take by mouth daily.     gabapentin (NEURONTIN) 300 MG capsule Take 300 mg by mouth 3 (three) times daily as needed. Taking one capsule at HS     ibuprofen (ADVIL) 200 MG tablet Take by mouth.     Melatonin 3 MG TABS Take by mouth.     metoprolol tartrate (LOPRESSOR) 25 MG tablet TAKE 1/4 TABLET (6.25 MG) BY MOUTH TWICE DAILY 45 tablet 3   Pyridoxine HCl (VITAMIN B6 PO) Take by mouth daily.     vitamin B-12 (CYANOCOBALAMIN) 500 MCG tablet Take 500 mcg by mouth in the morning and  at bedtime.     No current facility-administered medications for this visit.    Allergies:   Patient has no known allergies.   Social History:  The patient  reports that he has never smoked. He has never used smokeless tobacco. He reports that he does not drink alcohol and does not use drugs.   Family History:   family history includes Alcohol abuse in his mother; Cirrhosis in his mother; Psychiatric Illness in his father.    Review of Systems: Review of Systems  Constitutional: Negative.   HENT: Negative.    Respiratory: Negative.  Negative for shortness of breath.   Cardiovascular: Negative.  Negative for chest pain.  Gastrointestinal: Negative.   Musculoskeletal: Negative.   Neurological: Negative.   Psychiatric/Behavioral: Negative.    All other systems reviewed and are negative.   PHYSICAL EXAM: VS:  BP 100/70 (BP Location: Left Arm, Patient Position: Sitting, Cuff Size: Normal)   Pulse 69   Ht 5\' 8"  (1.727 m)   Wt 183 lb 8 oz (83.2 kg)   SpO2 97%   BMI 27.90 kg/m  , BMI Body mass index is 27.9 kg/m. Constitutional:  oriented to person, place, and time. No distress.  HENT:  Head: Grossly normal Eyes:  no discharge. No scleral icterus.  Neck: No JVD, no carotid bruits  Cardiovascular: Regular rate and rhythm, no murmurs appreciated Pulmonary/Chest: Clear to auscultation bilaterally, no wheezes or rails Abdominal: Soft.  no distension.  no tenderness.  Musculoskeletal: Normal range of motion Neurological:  normal muscle tone. Coordination normal. No atrophy Skin: Skin warm and dry Psychiatric: normal affect, pleasant  Recent Labs: No results found for requested labs within last 365 days.    Lipid Panel Lab Results  Component Value Date   CHOL 116 11/12/2019   HDL 42 11/12/2019   LDLCALC 56 11/12/2019   TRIG 92 11/12/2019      Wt Readings from Last 3 Encounters:  04/05/22 183 lb 8 oz (83.2 kg)  11/03/20 181 lb (82.1 kg)  11/12/19 182 lb (82.6 kg)     ASSESSMENT AND PLAN:  Coronary artery disease of native artery of native heart with stable angina pectoris (HCC) Severe three-vessel disease, s/p CABG Currently with no symptoms of angina. No further workup at this time. Continue current medication regimen.  Mixed hyperlipidemia Cholesterol is at goal in 2022 on the current lipid regimen. No changes to the medications were made. Repeat lipid panel ordered  S/P CABG x 5   Elevated PSA Followed by primary care  Hepatitis C Followed at [facility] Cirrhotic liver morphology noted on MRI April 2022, stable LFTs ordered  Total encounter time more than 30 minutes Greater than 50% was spent in counseling and coordination of care with the patient    Orders Placed This Encounter  Procedures   EKG 12-Lead    Signed, Dossie Arbour, M.D., Ph.D. 04/05/2022  [facility] [place], [place] [phone]

## 2022-04-05 NOTE — Patient Instructions (Addendum)
Medication Instructions:  When you run out of plavix/clopidogrel, ok to stop   If you need a refill on your cardiac medications before your next appointment, please call your pharmacy.   Lab work: Your physician recommends that you return for a FASTING lipid profile:   - You will need to be fasting. Please do not have anything to eat or drink after midnight the morning you have the lab work. You may only have water or black coffee with no cream or sugar.   - Please go to the [facility]. You will check in at the front desk to the right as you walk into the atrium. Valet Parking is offered if needed. - No appointment needed. You may go any day between 7 am and 6 pm.    Testing/Procedures: No new testing needed  Follow-Up: At [facility], you and your health needs are our priority.  As part of our continuing mission to provide you with exceptional heart care, we have created designated Provider Care Teams.  These Care Teams include your primary Cardiologist (physician) and Advanced Practice Providers (APPs -  Physician Assistants and Nurse Practitioners) who all work together to provide you with the care you need, when you need it.  You will need a follow up appointment in 12 months  Providers on your designated Care Team:   Nicolasa Ducking, NP Eula Listen, PA-C Cadence Fransico Michael, [place]  COVID-19 Vaccine Information can be found at: [email or web address] For questions related to vaccine distribution or appointments, please email [email or web address] or call [phone].

## 2022-04-06 ENCOUNTER — Telehealth: Payer: Self-pay | Admitting: *Deleted

## 2022-04-06 NOTE — Telephone Encounter (Signed)
PA required fo Sildenafile Citrate 20 mg tablet. PA has been started for this medication. Please advise correct DX for medication.  1. Does the patient have a diagnosis of pulmonary arterial hypertension (PAH) (World Health Organization [WHO] Group 1)? -yes -no  2. If yest, Has pulmonary arterial hypertension (PAH) been confirmed by right heart catheterization? - Yes -No  Pt has hx of Left heart cath and Coronary Angiography.

## 2022-04-10 NOTE — Telephone Encounter (Signed)
ty

## 2022-04-15 ENCOUNTER — Other Ambulatory Visit: Payer: Self-pay | Admitting: Cardiovascular Disease

## 2022-07-12 ENCOUNTER — Other Ambulatory Visit: Payer: Self-pay | Admitting: Cardiovascular Disease

## 2022-07-12 DIAGNOSIS — E782 Mixed hyperlipidemia: Secondary | ICD-10-CM

## 2022-07-17 ENCOUNTER — Other Ambulatory Visit: Payer: Self-pay

## 2022-07-17 ENCOUNTER — Other Ambulatory Visit: Payer: Self-pay | Admitting: Cardiovascular Disease

## 2022-07-17 DIAGNOSIS — E782 Mixed hyperlipidemia: Secondary | ICD-10-CM

## 2022-07-17 MED ORDER — ATORVASTATIN CALCIUM 40 MG PO TABS: ORAL_TABLET | ORAL | 2 refills | Status: DC

## 2023-03-30 ENCOUNTER — Other Ambulatory Visit: Payer: Self-pay | Admitting: Cardiovascular Disease

## 2023-03-30 DIAGNOSIS — E782 Mixed hyperlipidemia: Secondary | ICD-10-CM

## 2023-03-30 NOTE — Telephone Encounter (Signed)
Pt scheduled on 9/10

## 2023-03-30 NOTE — Telephone Encounter (Signed)
Please contact pt for future f/u pt due for 12 month f/u.

## 2023-04-29 ENCOUNTER — Other Ambulatory Visit: Payer: Self-pay | Admitting: Cardiovascular Disease

## 2023-04-29 DIAGNOSIS — E782 Mixed hyperlipidemia: Secondary | ICD-10-CM

## 2023-05-28 NOTE — Progress Notes (Signed)
Cardiology Office Note  Date:  05/29/2023   ID:  Jordan Wong, DOB 1950-09-26, MRN [id number]  PCP:  Jerl Mina, MD   Chief Complaint  Patient presents with   12 month follow up     Patient c/o tiredness more than usual. Medications reviewed by the patient verbally.     HPI:  72 year old male with a history of  hepatitis C (successfully treated), liver cirrhosis, remote alcohol gout,  hyperlipidemia, CAD,  status post CABG. June 2018 EF 55% in 2018 Who presents for follow up of his CAD, CABG  Last seen in clinic by myself July 2023 Recently went [place] No regular exercise program Denies chest pain on exertion  Runs a motorcycle shop No lower extremity edema, no PND orthopnea Not on blood pressure medication No recent lab work available since 2022  Previously followed at [facility] by hepatology Liver MRI showing cirrhotic liver morphology, no significant change from prior study October 2019  Lab work reviewed from April 2022 Total cholesterol 133, LDL 76 Normal LFTs  EKG personally reviewed by myself on todays visit EKG Interpretation Date/Time:  Tuesday May 29 2023 14:37:43 EDT Ventricular Rate:  75 PR Interval:  156 QRS Duration:  96 QT Interval:  390 QTC Calculation: 435 R Axis:   -35  Text Interpretation: Normal sinus rhythm Left axis deviation Incomplete right bundle branch block When compared with ECG of 24-Feb-2017 07:29, No significant change was found Confirmed by Julien Nordmann [phone]) on 05/29/2023 2:51:25 PM    Past medical history reviewed 02/2017 MV:  septal, apical, dist antsept ischemia, EF 50%, 1mm ST dep w/ exercise;   02/2017 Cath:  LM nl, LAD 99p, 50d, D1 90ost, RI 95ost, LCX 30m/d, RCA 95p, EF 55-65%;   CABG x 5: 02/2017  LIMA->LAD, free RIMA->RCA, VG->Diag, VG->RI, VG->OM.  Previous hospitalization reviewed with him,  Unstable angina leading to positive stress test, admission to the hospital  NSTEMI   catheterization Transferred to the hospital for bypass surgery   PMH:   has a past medical history of CAD (coronary artery disease), Hepatitis C (2016), History of blood transfusion (1987), History of echocardiogram, History of gout, HLD (hyperlipidemia), Hyperglycemia, and Pneumonia (2000s X 1).  PSH:    Past Surgical History:  Procedure Laterality Date   CARDIAC CATHETERIZATION  02/22/2017   CORONARY ARTERY BYPASS GRAFT N/A 02/23/2017   Procedure: CORONARY ARTERY BYPASS GRAFT times five using Bilateral Mammary arteries and Right leg saphenous vein;  Surgeon: Alleen Borne, MD;  Location: [facility] OR;  Service: Open Heart Surgery;  Laterality: N/A;   EYE SURGERY     FEMUR FRACTURE SURGERY Left 1987; 1988   "1st one didn't heal right; had to go in & put more bone i it"   HAND SURGERY Right    INGUINAL HERNIA REPAIR Bilateral [phone]   LEFT HEART CATH AND CORONARY ANGIOGRAPHY N/A 02/22/2017   Procedure: Left Heart Cath and Coronary Angiography;  Surgeon: Iran Ouch, MD;  Location: [facility] INVASIVE CV LAB;  Service: Cardiovascular;  Laterality: N/A;   NOSE SURGERY Left    pre-cancerous cells removed   RETINAL DETACHMENT SURGERY Right    TEE WITHOUT CARDIOVERSION N/A 02/23/2017   Procedure: TRANSESOPHAGEAL ECHOCARDIOGRAM (TEE);  Surgeon: Alleen Borne, MD;  Location: [facility] OR;  Service: Open Heart Surgery;  Laterality: N/A;    Current Outpatient Medications  Medication Sig Dispense Refill   acetaminophen (TYLENOL) 325 MG tablet Take 2 tablets (650 mg total) by mouth  every 6 (six) hours as needed for mild pain (or Fever >/= 101).     aspirin EC 81 MG tablet Take 1 tablet (81 mg total) by mouth daily. 90 tablet 3   atorvastatin (LIPITOR) 40 MG tablet TAKE 1/2 TABLET (20 MG TOTAL) BY MOUTH DAILY AT 6 PM. 45 tablet 0   Ferrous Sulfate (IRON PO) Take by mouth daily.     gabapentin (NEURONTIN) 300 MG capsule Take 300 mg by mouth 3 (three) times daily as needed. Taking one capsule at HS      ibuprofen (ADVIL) 200 MG tablet Take by mouth.     Melatonin 3 MG TABS Take by mouth.     Pyridoxine HCl (VITAMIN B6 PO) Take by mouth daily.     sildenafil (REVATIO) 20 MG tablet Take 1 tablet (20 mg total) by mouth 3 (three) times daily as needed. 90 tablet 1   vitamin B-12 (CYANOCOBALAMIN) 500 MCG tablet Take 500 mcg by mouth in the morning and at bedtime.     No current facility-administered medications for this visit.    Allergies:   Patient has no known allergies.   Social History:  The patient  reports that he has never smoked. He has never used smokeless tobacco. He reports that he does not drink alcohol and does not use drugs.   Family History:   family history includes Alcohol abuse in his mother; Cirrhosis in his mother; Psychiatric Illness in his father.   Review of Systems: Review of Systems  Constitutional: Negative.   HENT: Negative.    Respiratory: Negative.  Negative for shortness of breath.   Cardiovascular: Negative.  Negative for chest pain.  Gastrointestinal: Negative.   Musculoskeletal: Negative.   Neurological: Negative.   Psychiatric/Behavioral: Negative.    All other systems reviewed and are negative.   PHYSICAL EXAM: VS:  BP 100/64 (BP Location: Left Arm, Patient Position: Sitting, Cuff Size: Large)   Pulse 75   Ht 5\' 8"  (1.727 m)   Wt 184 lb 8 oz (83.7 kg)   SpO2 97%   BMI 28.05 kg/m  , BMI Body mass index is 28.05 kg/m. Constitutional:  oriented to person, place, and time. No distress.  HENT:  Head: Grossly normal Eyes:  no discharge. No scleral icterus.  Neck: No JVD, no carotid bruits  Cardiovascular: Regular rate and rhythm, no murmurs appreciated Pulmonary/Chest: Clear to auscultation bilaterally, no wheezes or rails Abdominal: Soft.  no distension.  no tenderness.  Musculoskeletal: Normal range of motion Neurological:  normal muscle tone. Coordination normal. No atrophy Skin: Skin warm and dry Psychiatric: normal affect,  pleasant  Recent Labs: No results found for requested labs within last 365 days.    Lipid Panel Lab Results  Component Value Date   CHOL 116 11/12/2019   HDL 42 11/12/2019   LDLCALC 56 11/12/2019   TRIG 92 11/12/2019      Wt Readings from Last 3 Encounters:  05/29/23 184 lb 8 oz (83.7 kg)  04/05/22 183 lb 8 oz (83.2 kg)  11/03/20 181 lb (82.1 kg)    ASSESSMENT AND PLAN:  Coronary artery disease of native artery of native heart with stable angina pectoris (HCC) Severe three-vessel disease, s/p CABG Currently with no symptoms of angina. No further workup at this time. Continue current medication regimen. Lab work ordered  Mixed hyperlipidemia Cholesterol is at goal in 2022  Repeat labs ordered, goal LDL less than 70  S/P CABG x 5 Denies anginal symptoms  Elevated PSA Followed  by primary care  Hepatitis C Previously followed at [facility] Cirrhotic liver morphology noted on MRI April 2022, stable Lab work ordered  Total encounter time more than 30 minutes Greater than 50% was spent in counseling and coordination of care with the patient    Orders Placed This Encounter  Procedures   EKG 12-Lead    Signed, Dossie Arbour, M.D., Ph.D. 05/29/2023  [facility] [place], [place] [phone]

## 2023-05-29 ENCOUNTER — Encounter: Payer: Self-pay | Admitting: Cardiovascular Disease

## 2023-05-29 ENCOUNTER — Ambulatory Visit: Payer: Medicare HMO | Attending: Cardiovascular Disease | Admitting: Cardiovascular Disease

## 2023-05-29 VITALS — BP 100/64 | HR 75 | Ht 68.0 in | Wt 184.5 lb

## 2023-05-29 DIAGNOSIS — D696 Thrombocytopenia, unspecified: Secondary | ICD-10-CM

## 2023-05-29 DIAGNOSIS — Z951 Presence of aortocoronary bypass graft: Secondary | ICD-10-CM

## 2023-05-29 DIAGNOSIS — I25118 Atherosclerotic heart disease of native coronary artery with other forms of angina pectoris: Secondary | ICD-10-CM | POA: Diagnosis not present

## 2023-05-29 DIAGNOSIS — I25110 Atherosclerotic heart disease of native coronary artery with unstable angina pectoris: Secondary | ICD-10-CM

## 2023-05-29 DIAGNOSIS — E782 Mixed hyperlipidemia: Secondary | ICD-10-CM

## 2023-05-29 DIAGNOSIS — Z79899 Other long term (current) drug therapy: Secondary | ICD-10-CM

## 2023-05-29 DIAGNOSIS — I200 Unstable angina: Secondary | ICD-10-CM

## 2023-05-29 MED ORDER — SILDENAFIL CITRATE 20 MG PO TABS: 20.0000 mg | ORAL_TABLET | Freq: Three times a day (TID) | ORAL | 3 refills | Status: AC | PRN

## 2023-05-29 NOTE — Patient Instructions (Addendum)
Medication Instructions:  No changes  If you need a refill on your cardiac medications before your next appointment, please call your pharmacy.   Lab work: Labs today: Lipids, CMP, CBC  Testing/Procedures: No new testing needed  Follow-Up: At [facility], you and your health needs are our priority.  As part of our continuing mission to provide you with exceptional heart care, we have created designated Provider Care Teams.  These Care Teams include your primary Cardiologist (physician) and Advanced Practice Providers (APPs -  Physician Assistants and Nurse Practitioners) who all work together to provide you with the care you need, when you need it.  You will need a follow up appointment in 12 months  Providers on your designated Care Team:   Nicolasa Ducking, NP Eula Listen, PA-C Cadence Fransico Michael, [place]  COVID-19 Vaccine Information can be found at: [email or web address] For questions related to vaccine distribution or appointments, please email [email or web address] or call [phone].

## 2023-05-30 LAB — LIPID PANEL
Chol/HDL Ratio: 3.3 ratio (ref 0.0–5.0)
Cholesterol, Total: 123 mg/dL (ref 100–199)
HDL: 37 mg/dL — ABNORMAL LOW (ref >39)
LDL Chol Calc (NIH): 62 mg/dL (ref 0–99)
Triglycerides: 133 mg/dL (ref 0–149)
VLDL Cholesterol Cal: 24 mg/dL (ref 5–40)

## 2023-05-30 LAB — CBC
Hematocrit: 40 % (ref 37.5–51.0)
Hemoglobin: 13.2 g/dL (ref 13.0–17.7)
MCH: 30.7 pg (ref 26.6–33.0)
MCHC: 33 g/dL (ref 31.5–35.7)
MCV: 93 fL (ref 79–97)
Platelets: 174 10*3/uL (ref 150–450)
RBC: 4.3 x10E6/uL (ref 4.14–5.80)
RDW: 14 % (ref 11.6–15.4)
WBC: 7.1 10*3/uL (ref 3.4–10.8)

## 2023-05-30 LAB — COMPREHENSIVE METABOLIC PANEL
ALT: 21 IU/L (ref 0–44)
AST: 20 IU/L (ref 0–40)
Albumin: 4.4 g/dL (ref 3.8–4.8)
Alkaline Phosphatase: 61 IU/L (ref 44–121)
BUN/Creatinine Ratio: 7 — ABNORMAL LOW (ref 10–24)
BUN: 8 mg/dL (ref 8–27)
Bilirubin Total: 0.5 mg/dL (ref 0.0–1.2)
CO2: 24 mmol/L (ref 20–29)
Calcium: 9.1 mg/dL (ref 8.6–10.2)
Chloride: 103 mmol/L (ref 96–106)
Creatinine, Ser: 1.09 mg/dL (ref 0.76–1.27)
Globulin, Total: 2.1 g/dL (ref 1.5–4.5)
Glucose: 88 mg/dL (ref 70–99)
Potassium: 4.2 mmol/L (ref 3.5–5.2)
Sodium: 139 mmol/L (ref 134–144)
Total Protein: 6.5 g/dL (ref 6.0–8.5)
eGFR: 73 mL/min/{1.73_m2} (ref >59)

## 2023-07-27 ENCOUNTER — Other Ambulatory Visit: Payer: Self-pay | Admitting: Cardiovascular Disease

## 2023-07-27 DIAGNOSIS — E782 Mixed hyperlipidemia: Secondary | ICD-10-CM

## 2024-05-02 ENCOUNTER — Other Ambulatory Visit: Payer: Self-pay | Admitting: Cardiovascular Disease

## 2024-05-02 DIAGNOSIS — E782 Mixed hyperlipidemia: Secondary | ICD-10-CM

## 2024-05-30 ENCOUNTER — Other Ambulatory Visit: Payer: Self-pay | Admitting: Cardiovascular Disease

## 2024-05-30 DIAGNOSIS — E782 Mixed hyperlipidemia: Secondary | ICD-10-CM

## 2024-07-09 LAB — COLOGUARD: COLOGUARD: NEGATIVE

## 2024-11-10 ENCOUNTER — Ambulatory Visit: Admitting: Cardiovascular Disease
